# Patient Record
Sex: Male | Born: 1970 | Hispanic: No | Marital: Married | Smoking: Former smoker
Health system: Southern US, Community
[De-identification: ages and names within clinical notes are randomized; demographics above are authoritative.]

## PROBLEM LIST (undated history)

## (undated) DIAGNOSIS — F329 Major depressive disorder, single episode, unspecified: Secondary | ICD-10-CM

## (undated) DIAGNOSIS — F32A Depression, unspecified: Secondary | ICD-10-CM

## (undated) DIAGNOSIS — I499 Cardiac arrhythmia, unspecified: Secondary | ICD-10-CM

## (undated) DIAGNOSIS — M109 Gout, unspecified: Secondary | ICD-10-CM

## (undated) DIAGNOSIS — I10 Essential (primary) hypertension: Secondary | ICD-10-CM

## (undated) DIAGNOSIS — E785 Hyperlipidemia, unspecified: Secondary | ICD-10-CM

## (undated) DIAGNOSIS — M1990 Unspecified osteoarthritis, unspecified site: Secondary | ICD-10-CM

## (undated) DIAGNOSIS — C801 Malignant (primary) neoplasm, unspecified: Secondary | ICD-10-CM

## (undated) DIAGNOSIS — K219 Gastro-esophageal reflux disease without esophagitis: Secondary | ICD-10-CM

## (undated) DIAGNOSIS — K047 Periapical abscess without sinus: Secondary | ICD-10-CM

## (undated) DIAGNOSIS — R002 Palpitations: Secondary | ICD-10-CM

## (undated) DIAGNOSIS — F1020 Alcohol dependence, uncomplicated: Secondary | ICD-10-CM

## (undated) DIAGNOSIS — F419 Anxiety disorder, unspecified: Secondary | ICD-10-CM

## (undated) DIAGNOSIS — K5792 Diverticulitis of intestine, part unspecified, without perforation or abscess without bleeding: Secondary | ICD-10-CM

## (undated) DIAGNOSIS — K279 Peptic ulcer, site unspecified, unspecified as acute or chronic, without hemorrhage or perforation: Secondary | ICD-10-CM

## (undated) DIAGNOSIS — L0231 Cutaneous abscess of buttock: Secondary | ICD-10-CM

## (undated) DIAGNOSIS — L989 Disorder of the skin and subcutaneous tissue, unspecified: Secondary | ICD-10-CM

## (undated) HISTORY — DX: Depression, unspecified: F32.A

## (undated) HISTORY — DX: Diverticulitis of intestine, part unspecified, without perforation or abscess without bleeding: K57.92

## (undated) HISTORY — DX: Palpitations: R00.2

## (undated) HISTORY — DX: Gastro-esophageal reflux disease without esophagitis: K21.9

## (undated) HISTORY — DX: Hyperlipidemia, unspecified: E78.5

## (undated) HISTORY — DX: Anxiety disorder, unspecified: F41.9

## (undated) HISTORY — DX: Cutaneous abscess of buttock: L02.31

## (undated) HISTORY — DX: Major depressive disorder, single episode, unspecified: F32.9

## (undated) HISTORY — DX: Periapical abscess without sinus: K04.7

## (undated) HISTORY — DX: Alcohol dependence, uncomplicated: F10.20

## (undated) HISTORY — DX: Essential (primary) hypertension: I10

## (undated) HISTORY — DX: Peptic ulcer, site unspecified, unspecified as acute or chronic, without hemorrhage or perforation: K27.9

---

## 1997-06-28 HISTORY — PX: HAND SURGERY: SHX662

## 2010-06-30 ENCOUNTER — Ambulatory Visit
Admission: RE | Admit: 2010-06-30 | Discharge: 2010-06-30 | Payer: Self-pay | Source: Home / Self Care | Attending: Family Medicine | Admitting: Family Medicine

## 2010-06-30 DIAGNOSIS — K047 Periapical abscess without sinus: Secondary | ICD-10-CM | POA: Insufficient documentation

## 2010-06-30 DIAGNOSIS — E669 Obesity, unspecified: Secondary | ICD-10-CM | POA: Insufficient documentation

## 2010-06-30 DIAGNOSIS — L0291 Cutaneous abscess, unspecified: Secondary | ICD-10-CM

## 2010-06-30 DIAGNOSIS — L0390 Cellulitis, unspecified: Secondary | ICD-10-CM

## 2010-07-02 ENCOUNTER — Encounter: Payer: Self-pay | Admitting: Family Medicine

## 2010-07-09 ENCOUNTER — Ambulatory Visit: Admit: 2010-07-09 | Payer: Self-pay | Admitting: Family Medicine

## 2010-07-30 NOTE — Assessment & Plan Note (Signed)
Summary: NOV: ABscess buttock, abscess tooth   Vital Signs:  Patient profile:   40 year old male Height:      74 inches Weight:      290 pounds Pulse rate:   100 / minute BP sitting:   149 / 102  (right arm) Cuff size:   regular  Vitals Entered By: Avon Gully CMA, Duncan Dull) (June 30, 2010 2:37 PM) CC: NP-cyst in the crease of buttocks x 3 days   CC:  NP-cyst in the crease of buttocks x 3 days.  History of Present Illness: 3 days ago noticed a boil. Never had one on his buttock before. No drainage.  No fever.  Occ gets a boil on the groin creases.  Never had ot have I & D. They usuall resolve on their own.   Tooth infection on bottom of the right jaw.  Has noticed some drianage. Trying to get into a dentist. Hives on PCN.    Habits & Providers  Alcohol-Tobacco-Diet     Tobacco Status: current     Cigarette Packs/Day: 1.0  Exercise-Depression-Behavior     Does Patient Exercise: no     STD Risk: never     Drug Use: yes     Seat Belt Use: always  Current Medications (verified): 1)  None  Allergies (verified): 1)  ! Penicillin  Comments:  Nurse/Medical Assistant: The patient's medications and allergies were reviewed with the patient and were updated in the Medication and Allergy Lists. Avon Gully CMA, Duncan Dull) (June 30, 2010 2:40 PM)  Past History:  Past Surgical History: Right hand 1999  Family History: Father with alcoholism, Cancer, HTN  Social History: Current Smoker Alcohol use-yes Drug use-yes Regular exercise-no Smoking Status:  current Packs/Day:  1.0 Does Patient Exercise:  no STD Risk:  never Drug Use:  yes Seat Belt Use:  always  Physical Exam  General:  Well-developed,well-nourished,in no acute distress; alert,appropriate and cooperative throughout examination Mouth:  Bootm right jaw last molar is loose and has a white collored drainage around it.  Skin:  Approx 6 cm of induration right over the buttock crease. These was a  pustle in the center which I lanced and it started to drain instantly.  Sample collected and send for culture to rule out MRSA.      Impression & Recommendations:  Problem # 1:  ABSCESS, SKIN (ICD-682.9) Assessment New  Orders: T-Culture,Abcess ([id number])  His updated medication list for this problem includes:    Cleocin 300 Mg Caps (Clindamycin hcl) .Marland Kitchen... Take 1 tablet by mouth three times a day for 10 days    Doxycycline Hyclate 100 Mg Caps (Doxycycline hyclate) .Marland Kitchen... Take 1 tablet by mouth two times a day for 10 days  Elevate affected area. Warm moist compresses for 20 minutes every 2 hours while awake. Take antibiotics as directed and take acetaminophen as needed. To be seen in 48-72 hours if no improvement, sooner if worse.  Problem # 2:  ABSCESS, TOOTH (ICD-522.5) Assessment: New Needs to get in with dentist but in the meantime can take antibiotic to get his infection under control. Also given short supply fo pain med. Given clindamycin.   Complete Medication List: 1)  Cleocin 300 Mg Caps (Clindamycin hcl) .... Take 1 tablet by mouth three times a day for 10 days 2)  Doxycycline Hyclate 100 Mg Caps (Doxycycline hyclate) .... Take 1 tablet by mouth two times a day for 10 days 3)  Hydrocodone-acetaminophen 5-500 Mg Tabs (Hydrocodone-acetaminophen) .Marland Kitchen.. 1-2  tabs by mouth every 4-6 hours as needed  Patient Instructions: 1)  Soak in warm bath tonight and next couple of days to help it drain. 2)  Complete your antibiotics 3)  Follow up on Friday and let me make sure the area is healing and doesn't need to be incised.  Prescriptions: HYDROCODONE-ACETAMINOPHEN 5-500 MG TABS (HYDROCODONE-ACETAMINOPHEN) 1-2 tabs by mouth every 4-6 hours as needed  #25 x 0   Entered and Authorized by:   Nani Gasser MD   Signed by:   Nani Gasser MD on 06/30/2010   Method used:   Print then Give to Patient   RxID:   [id number] DOXYCYCLINE HYCLATE 100 MG CAPS (DOXYCYCLINE  HYCLATE) Take 1 tablet by mouth two times a day for 10 days  #20 x 0   Entered and Authorized by:   Nani Gasser MD   Signed by:   Nani Gasser MD on 06/30/2010   Method used:   Electronically to        [facility] [phone]* (retail)       [address] [place], [place]  [address]       Ph: [phone]       Fax: [phone]   RxID:   [id number] CLEOCIN 300 MG CAPS (CLINDAMYCIN HCL) Take 1 tablet by mouth three times a day for 10 days  #30 x 0   Entered and Authorized by:   Nani Gasser MD   Signed by:   Nani Gasser MD on 06/30/2010   Method used:   Electronically to        [facility] [phone]* (retail)       [address] [place], [place]  [address]       Ph: [phone]       Fax: [phone]   RxID:   [id number]    Orders Added: 1)  T-Culture,Abcess [[id number]] 2)  New Patient Level IV [20254]

## 2011-03-29 ENCOUNTER — Encounter: Payer: Self-pay | Admitting: Cardiology

## 2011-03-30 ENCOUNTER — Encounter: Payer: Self-pay | Admitting: *Deleted

## 2011-03-31 ENCOUNTER — Ambulatory Visit (INDEPENDENT_AMBULATORY_CARE_PROVIDER_SITE_OTHER): Payer: PRIVATE HEALTH INSURANCE | Admitting: Cardiology

## 2011-03-31 ENCOUNTER — Encounter: Payer: Self-pay | Admitting: Cardiology

## 2011-03-31 DIAGNOSIS — Z720 Tobacco use: Secondary | ICD-10-CM | POA: Insufficient documentation

## 2011-03-31 DIAGNOSIS — F17200 Nicotine dependence, unspecified, uncomplicated: Secondary | ICD-10-CM

## 2011-03-31 DIAGNOSIS — I10 Essential (primary) hypertension: Secondary | ICD-10-CM

## 2011-03-31 DIAGNOSIS — R002 Palpitations: Secondary | ICD-10-CM

## 2011-03-31 MED ORDER — METOPROLOL SUCCINATE ER 25 MG PO TB24: 25.0000 mg | ORAL_TABLET | Freq: Every day | ORAL | Status: DC

## 2011-03-31 NOTE — Progress Notes (Signed)
HPI: 40 year old male with no prior cardiac history or evaluation of palpitations. Patient states that he has had intermittent palpitations for approximately one year. He correlates the timing with a trial of chantix. They have occurred again in the past several weeks. He describes the sensation as feeling his heart "stop". He feels a pressure sensation in his head. There is no associated syncope, chest pain or dyspnea. B ecause of the above we were asked to further evaluate. Patient does have mild dyspnea on exertion but no orthopnea, PND, pedal edema, exertional chest pain or history of syncope.  Current Outpatient Prescriptions  Medication Sig Dispense Refill  . HYDROcodone-acetaminophen (VICODIN) 5-500 MG per tablet Take 1 tablet by mouth every 6 (six) hours as needed.          Allergies  Allergen Reactions  . Penicillins     Past Medical History  Diagnosis Date  . Abscess of buttock   . Abscessed tooth   . Peptic ulcer     Past Surgical History  Procedure Date  . Hand surgery 1999    Right    History   Social History  . Marital Status: Married    Spouse Name: N/A    Number of Children: N/A  . Years of Education: N/A   Occupational History  .      Plumber   Social History Main Topics  . Smoking status: Current Everyday Smoker  . Smokeless tobacco: Not on file  . Alcohol Use: Yes     2-3 beers per day  . Drug Use: Yes     Marijuana  . Sexually Active: Not on file   Other Topics Concern  . Not on file   Social History Narrative   Does not get regular exercise    Family History  Problem Relation Age of Onset  . Hypertension Father   . Cancer Father   . Alcohol abuse Father   . Heart disease Father     atrial fibrillation    ROS: no fevers or chills, productive cough, hemoptysis, dysphasia, odynophagia, melena, hematochezia, dysuria, hematuria, rash, seizure activity, orthopnea, PND, pedal edema, claudication. Remaining systems are negative.  Physical  Exam: General:  Well developed/well nourished in NAD Skin warm/dry Patient not depressed No peripheral clubbing Back-normal HEENT-normal/normal eyelids Neck supple/normal carotid upstroke bilaterally; no bruits; no JVD; no thyromegaly chest - CTA/ normal expansion CV - RRR/normal S1 and S2; no murmurs, rubs or gallops;  PMI nondisplaced Abdomen -NT/ND, no HSM, no mass, + bowel sounds, no bruit 2+ femoral pulses, no bruits Ext-no edema, chords, 2+ DP Neuro-grossly nonfocal  ECG NSR, LAD, no ST changes

## 2011-03-31 NOTE — Assessment & Plan Note (Signed)
Symptoms sound most likely to be related to PVCs. Will arrange echocardiogram to quantify LV function. Check TSH, magnesium and potassium. Begin Toprol 25 mg daily. If symptoms do not improve will consider CardioNet.

## 2011-03-31 NOTE — Patient Instructions (Signed)
Your physician recommends that you schedule a follow-up appointment in: 8 WEEKS  Your physician has requested that you have an echocardiogram. Echocardiography is a painless test that uses sound waves to create images of your heart. It provides your doctor with information about the size and shape of your heart and how well your heart's chambers and valves are working. This procedure takes approximately one hour. There are no restrictions for this procedure.   START METOPROLOL SUCC 25 MG ONCE DAILY  Your physician recommends that you return for lab work in: TODAY

## 2011-03-31 NOTE — Assessment & Plan Note (Signed)
Patient counseled on discontinuing. 

## 2011-03-31 NOTE — Assessment & Plan Note (Addendum)
Blood pressure is elevated today but he does not carry a diagnosis of hypertension. He states it is typically 140/80 at home. Begin Toprol for palpitations which should also help with blood pressure.

## 2011-04-01 LAB — BASIC METABOLIC PANEL
Chloride: 105 mEq/L (ref 96–112)
Glucose, Bld: 88 mg/dL (ref 70–99)
Potassium: 4.5 mEq/L (ref 3.5–5.3)
Sodium: 140 mEq/L (ref 135–145)

## 2011-04-01 LAB — TSH: TSH: 1.283 u[IU]/mL (ref 0.350–4.500)

## 2011-04-01 LAB — MAGNESIUM: Magnesium: 2.1 mg/dL (ref 1.5–2.5)

## 2011-04-06 ENCOUNTER — Telehealth: Payer: Self-pay | Admitting: Cardiology

## 2011-04-06 ENCOUNTER — Ambulatory Visit (HOSPITAL_COMMUNITY): Payer: 59 | Attending: Cardiology | Admitting: Radiology

## 2011-04-06 DIAGNOSIS — F17200 Nicotine dependence, unspecified, uncomplicated: Secondary | ICD-10-CM | POA: Insufficient documentation

## 2011-04-06 DIAGNOSIS — I517 Cardiomegaly: Secondary | ICD-10-CM

## 2011-04-06 DIAGNOSIS — R002 Palpitations: Secondary | ICD-10-CM

## 2011-04-06 DIAGNOSIS — I10 Essential (primary) hypertension: Secondary | ICD-10-CM | POA: Insufficient documentation

## 2011-04-06 DIAGNOSIS — Z8249 Family history of ischemic heart disease and other diseases of the circulatory system: Secondary | ICD-10-CM | POA: Insufficient documentation

## 2011-04-06 NOTE — Telephone Encounter (Signed)
Patient states someone called him returning call back .

## 2011-04-06 NOTE — Telephone Encounter (Signed)
PT AWARE OF LAB RESULTS./CY 

## 2011-04-07 ENCOUNTER — Telehealth: Payer: Self-pay | Admitting: Cardiology

## 2011-04-07 DIAGNOSIS — R002 Palpitations: Secondary | ICD-10-CM

## 2011-04-07 DIAGNOSIS — I10 Essential (primary) hypertension: Secondary | ICD-10-CM

## 2011-04-07 MED ORDER — METOPROLOL SUCCINATE ER 50 MG PO TB24: 50.0000 mg | ORAL_TABLET | Freq: Every day | ORAL | Status: DC

## 2011-04-07 NOTE — Telephone Encounter (Signed)
Spoke with pt, he will call back if the palpitations do not get better on the increased metoprolol Deliah Goody

## 2011-04-07 NOTE — Telephone Encounter (Signed)
Spoke with pt, he is aware of echo results. He reports that last night he had an episode of his heart skipping. He states that it skipped three times in a row. He wants to make sure dr Jens Som is aware. The pt reports that when the skipping happens he gets a severe pain in his head. The pain is a new symptom that has just started happening Will forward for dr Jens Som review Thomas Pineda

## 2011-04-07 NOTE — Telephone Encounter (Signed)
Increase toprol to 50 mg po daily Thomas Pineda

## 2011-04-07 NOTE — Telephone Encounter (Signed)
Pt returning call to [place]. Please call back.

## 2011-04-07 NOTE — Telephone Encounter (Signed)
Pt got a call yesterday that he missed.  He thinks it might be his echo report.  Please call him this pm if possible.

## 2011-05-31 ENCOUNTER — Encounter: Payer: Self-pay | Admitting: *Deleted

## 2011-06-01 ENCOUNTER — Encounter: Payer: Self-pay | Admitting: Cardiology

## 2011-06-02 ENCOUNTER — Ambulatory Visit: Payer: PRIVATE HEALTH INSURANCE | Admitting: Cardiology

## 2011-07-21 HISTORY — PX: SQUAMOUS CELL CARCINOMA EXCISION: SHX2433

## 2011-07-26 ENCOUNTER — Emergency Department (INDEPENDENT_AMBULATORY_CARE_PROVIDER_SITE_OTHER)
Admission: EM | Admit: 2011-07-26 | Discharge: 2011-07-26 | Disposition: A | Discharge status: Home / Self Care | Payer: PRIVATE HEALTH INSURANCE | Source: Home / Self Care | Attending: Family Medicine | Admitting: Family Medicine

## 2011-07-26 ENCOUNTER — Encounter: Payer: Self-pay | Admitting: *Deleted

## 2011-07-26 DIAGNOSIS — IMO0001 Reserved for inherently not codable concepts without codable children: Secondary | ICD-10-CM

## 2011-07-26 DIAGNOSIS — Z4800 Encounter for change or removal of nonsurgical wound dressing: Secondary | ICD-10-CM

## 2011-07-26 HISTORY — DX: Disorder of the skin and subcutaneous tissue, unspecified: L98.9

## 2011-07-26 NOTE — ED Provider Notes (Signed)
History     CSN: [id number]  Arrival date & time 07/26/11  0808   First MD Initiated Contact with Patient 07/26/11 0840      Chief Complaint  Patient presents with  . Wound Check    RFA      HPI Comments: Patient reports that he had excisional biopsy of a lesion on his right forearm 5 days ago at [facility].  Presently taking oral Cleocin.  He has been changing the dressing daily and applying Triple antibiotic ointment.  He had had no problems until this morning when taking a shower he noted increased bleeding at the site.  He applied a pressure dressing and bleeding has stopped.  Patient is a 41 y.o. male presenting with wound check. The history is provided by the patient.  Wound Check  Treated in ED: 5 days ago. Prior ED Treatment: Lesion removal. Treatments since wound repair include oral antibiotics. There has been bloody discharge from the wound. There is no redness present. There is no swelling present. The pain has no pain.    Past Medical History  Diagnosis Date  . Abscess of buttock   . Abscessed tooth   . Peptic ulcer   . Hypertension   . Palpitations   . Skin abnormalities     Past Surgical History  Procedure Date  . Hand surgery 1999    Right  . Growth excision 07/21/11    RFA    Family History  Problem Relation Age of Onset  . Hypertension Father   . Cancer Father   . Alcohol abuse Father   . Heart disease Father     atrial fibrillation    History  Substance Use Topics  . Smoking status: Current Everyday Smoker  . Smokeless tobacco: Not on file  . Alcohol Use: Yes     2-3 beers per day      Review of Systems  All other systems reviewed and are negative.    Allergies  Penicillins  Home Medications   Current Outpatient Rx  Name Route Sig Dispense Refill  . CLINDAMYCIN HCL 300 MG PO CAPS Oral Take 300 mg by mouth 3 (three) times daily.      Marland Kitchen HYDROCODONE-ACETAMINOPHEN 5-500 MG PO TABS Oral Take 1 tablet by mouth every  6 (six) hours as needed.        BP 152/88  Pulse 104  Temp(Src) 98.4 F (36.9 C) (Oral)  Resp 16  Ht 6\' 2"  (1.88 m)  Wt 272 lb (123.378 kg)  BMI 34.92 kg/m2  SpO2 95%  Physical Exam  Nursing note and vitals reviewed. Constitutional: He appears well-developed and well-nourished. No distress.  Musculoskeletal:       Arms:      On the right mid-forearm there is a 2cm dia superficial wound with excellent granulation tissue.  Base is almost level with surrounding skin.  No active bleeding.  No purulent drainage.  No surrounding erythema or tenderness.    ED Course  Procedures  none      1. Wound check, dressing change       MDM  No evidence infection.  Suspect that patient opened capillary when changing dressing, resulting in increased bleeding. Applied small quantity of Bacitracin.  Applied Mepelex 7.5cm Border dressing followed by light compression dressing with Coban.  Given an additional Mepelex 7.5cm Border dressing. Replace Mepelex Border dressing in about four days; apply Bacitracin.  Followup with dermatologist if develops pain, swelling, increased drainage.  Keep wound  clean and dry.        Donna Christen, MD 07/26/11 [phone]

## 2011-07-26 NOTE — ED Notes (Signed)
Patient had a "growth" removed from his RFA 5 days ago. He has not has any issues with it until this AM. Noticed the site was bleeding more than it has been and upon moving the bandage reports "excessive bleeding that then began to spurt. Bleeding has stopped and site cleaned with Hibiclens.

## 2014-01-17 ENCOUNTER — Ambulatory Visit: Payer: PRIVATE HEALTH INSURANCE | Admitting: Family Medicine

## 2014-01-21 ENCOUNTER — Ambulatory Visit (INDEPENDENT_AMBULATORY_CARE_PROVIDER_SITE_OTHER): Payer: 59 | Admitting: Family Medicine

## 2014-01-21 ENCOUNTER — Encounter: Payer: Self-pay | Admitting: Family Medicine

## 2014-01-21 VITALS — BP 144/89 | HR 85 | Ht 74.0 in | Wt 278.0 lb

## 2014-01-21 DIAGNOSIS — R002 Palpitations: Secondary | ICD-10-CM

## 2014-01-21 NOTE — Progress Notes (Signed)
Subjective:    Patient ID: Thomas Pineda, male    DOB: 1971/02/20, 43 y.o.   MRN: [id number]  HPI Here today to discuss her palpitations-it started about 5 or 6 years ago. He started having the sensation that his heart was stopping her skipping a beat and then all of a sudden would start flutter. He says sometimes would last seconds to maybe even a minute or 2. Most the time fairly brief. He sometimes will experience feeling lightheaded afterwards. He says it happens multiple times in one day he'll actually get a little bit of soreness in the left side of his chest. When it first started about 5 or 6 years ago he actually went to his primary care office. They saw something worrisome on EKG and actually called EMS immediately taken to the emergency room. He said all the way to the emergency room to ENT on the ambulance, was commenting on his her regular heartbeat. But by the time he got to the emergency room his EKG was normal. He did have an echocardiogram after that and even saw cardiologist. He was told his echo was normal. His father does have a history of heart disease.  He is a former smoker and quit in January. He has gained about 30 pounds since then.  Review of Systems  BP 144/89  Pulse 85  Ht 6\' 2"  (1.88 m)  Wt 278 lb (126.1 kg)  BMI 35.68 kg/m2    Allergies  Allergen Reactions  . Penicillins     Past Medical History  Diagnosis Date  . Abscess of buttock   . Abscessed tooth   . Peptic ulcer   . Hypertension   . Palpitations   . Skin abnormalities     Past Surgical History  Procedure Laterality Date  . Hand surgery  1999    Right  . Growth excision  07/21/11    RFA    History   Social History  . Marital Status: Married    Spouse Name: N/A    Number of Children: N/A  . Years of Education: N/A   Occupational History  .      Plumber   Social History Main Topics  . Smoking status: Former Smoker    Quit date: 07/29/2013  . Smokeless tobacco: Not on file  .  Alcohol Use: Yes     Comment: 2-3 beers per day  . Drug Use: Yes    Special: Marijuana     Comment: Marijuana  . Sexual Activity: Not on file   Other Topics Concern  . Not on file   Social History Narrative   Does not get regular exercise    Family History  Problem Relation Age of Onset  . Hypertension Father   . Cancer Father   . Alcohol abuse Father   . Heart disease Father     atrial fibrillation    Outpatient Encounter Prescriptions as of 01/21/2014  Medication Sig  . oxyCODONE-acetaminophen (PERCOCET/ROXICET) 5-325 MG per tablet Take 1 tablet by mouth every 4 (four) hours as needed for severe pain.  . clindamycin (CLEOCIN) 300 MG capsule Take 300 mg by mouth 3 (three) times daily.    Marland Kitchen HYDROcodone-acetaminophen (VICODIN) 5-500 MG per tablet Take 1 tablet by mouth every 6 (six) hours as needed.            Objective:   Physical Exam  Constitutional: He is oriented to person, place, and time. He appears well-developed and well-nourished.  HENT:  Head:  Normocephalic and atraumatic.  Neck: Neck supple. No thyromegaly present.  Cardiovascular: Normal rate, regular rhythm and normal heart sounds.   Pulmonary/Chest: Effort normal and breath sounds normal.  Lymphadenopathy:    He has no cervical adenopathy.  Neurological: He is alert and oriented to person, place, and time.  Skin: Skin is warm and dry.  Psychiatric: He has a normal mood and affect. His behavior is normal.          Assessment & Plan:  Palpitations/skipped beats-will get EKG today. We'll schedule for a cardiac monitor for further evaluation. We'll also check blood work including evaluating for thyroid problem 7 anemia.  EKG EKG shows rate of 87 beats per minute with normal sinus rhythm. He does have what appears to be an old Q wave in lead 3. Normal axis. With no acute ST-T wave changes.  Tobacco abuse-congratulated him on smoking cessation. Encouraged him to work on diet since he is no longer  smoking. We'll also check his thyroid level since he has had a 30 pound weight gain in the last 6 months.

## 2014-01-22 LAB — CBC WITH DIFFERENTIAL/PLATELET
BASOS ABS: 0 10*3/uL (ref 0.0–0.1)
Basophils Relative: 0 % (ref 0–1)
EOS ABS: 0.2 10*3/uL (ref 0.0–0.7)
EOS PCT: 2 % (ref 0–5)
HCT: 43.3 % (ref 39.0–52.0)
Hemoglobin: 14.6 g/dL (ref 13.0–17.0)
Lymphocytes Relative: 24 % (ref 12–46)
Lymphs Abs: 2.6 10*3/uL (ref 0.7–4.0)
MCH: 29.8 pg (ref 26.0–34.0)
MCHC: 33.7 g/dL (ref 30.0–36.0)
MCV: 88.4 fL (ref 78.0–100.0)
MONO ABS: 0.7 10*3/uL (ref 0.1–1.0)
Monocytes Relative: 7 % (ref 3–12)
Neutro Abs: 7.2 10*3/uL (ref 1.7–7.7)
Neutrophils Relative %: 67 % (ref 43–77)
PLATELETS: 255 10*3/uL (ref 150–400)
RBC: 4.9 MIL/uL (ref 4.22–5.81)
RDW: 13.8 % (ref 11.5–15.5)
WBC: 10.7 10*3/uL — ABNORMAL HIGH (ref 4.0–10.5)

## 2014-01-22 LAB — COMPLETE METABOLIC PANEL WITH GFR
ALT: 49 U/L (ref 0–53)
AST: 26 U/L (ref 0–37)
Albumin: 4.4 g/dL (ref 3.5–5.2)
Alkaline Phosphatase: 83 U/L (ref 39–117)
BILIRUBIN TOTAL: 0.7 mg/dL (ref 0.2–1.2)
BUN: 14 mg/dL (ref 6–23)
CO2: 28 meq/L (ref 19–32)
CREATININE: 0.94 mg/dL (ref 0.50–1.35)
Calcium: 9.2 mg/dL (ref 8.4–10.5)
Chloride: 104 mEq/L (ref 96–112)
GFR, Est Non African American: >89 mL/min
Glucose, Bld: 67 mg/dL — ABNORMAL LOW (ref 70–99)
Potassium: 4.3 mEq/L (ref 3.5–5.3)
Sodium: 139 mEq/L (ref 135–145)
Total Protein: 7.1 g/dL (ref 6.0–8.3)

## 2014-01-22 LAB — TSH: TSH: 1.019 u[IU]/mL (ref 0.350–4.500)

## 2014-01-28 ENCOUNTER — Encounter: Payer: Self-pay | Admitting: *Deleted

## 2014-01-28 ENCOUNTER — Encounter (INDEPENDENT_AMBULATORY_CARE_PROVIDER_SITE_OTHER): Payer: 59

## 2014-01-28 DIAGNOSIS — R002 Palpitations: Secondary | ICD-10-CM

## 2014-01-28 NOTE — Progress Notes (Signed)
Patient ID: Thomas Pineda, male   DOB: 05-06-1971, 43 y.o.   MRN: [id number] [employer] 30 day cardiac event monitor applied to patient.

## 2014-02-19 ENCOUNTER — Other Ambulatory Visit: Payer: Self-pay | Admitting: *Deleted

## 2014-02-19 DIAGNOSIS — Z0000 Encounter for general adult medical examination without abnormal findings: Secondary | ICD-10-CM

## 2014-02-21 ENCOUNTER — Encounter: Payer: 59 | Admitting: Family Medicine

## 2014-03-09 ENCOUNTER — Encounter: Payer: Self-pay | Admitting: Family Medicine

## 2014-03-09 DIAGNOSIS — E785 Hyperlipidemia, unspecified: Secondary | ICD-10-CM | POA: Insufficient documentation

## 2014-03-09 LAB — LIPID PANEL
CHOL/HDL RATIO: 4.4 ratio
Cholesterol: 236 mg/dL — ABNORMAL HIGH (ref 0–200)
HDL: 54 mg/dL (ref >39)
LDL CALC: 113 mg/dL — AB (ref 0–99)
TRIGLYCERIDES: 343 mg/dL — AB (ref <150)
VLDL: 69 mg/dL — AB (ref 0–40)

## 2014-03-12 ENCOUNTER — Encounter: Payer: Self-pay | Admitting: Family Medicine

## 2014-03-12 ENCOUNTER — Ambulatory Visit (INDEPENDENT_AMBULATORY_CARE_PROVIDER_SITE_OTHER): Payer: 59 | Admitting: Family Medicine

## 2014-03-12 VITALS — BP 136/84 | HR 92 | Ht 74.0 in | Wt 293.0 lb

## 2014-03-12 DIAGNOSIS — R7309 Other abnormal glucose: Secondary | ICD-10-CM

## 2014-03-12 DIAGNOSIS — Z0000 Encounter for general adult medical examination without abnormal findings: Secondary | ICD-10-CM

## 2014-03-12 DIAGNOSIS — M25559 Pain in unspecified hip: Secondary | ICD-10-CM

## 2014-03-12 DIAGNOSIS — M25552 Pain in left hip: Secondary | ICD-10-CM

## 2014-03-12 LAB — POCT GLYCOSYLATED HEMOGLOBIN (HGB A1C): Hemoglobin A1C: 5.7

## 2014-03-12 MED ORDER — PHENTERMINE HCL 15 MG PO CAPS
15.0000 mg | ORAL_CAPSULE | ORAL | Status: DC
Start: 2014-03-12 — End: 2014-03-13

## 2014-03-12 NOTE — Progress Notes (Signed)
Subjective:    Patient ID: Thomas Pineda, male    DOB: Jan 27, 1971, 43 y.o.   MRN: [id number]  HPI  Here for CPE today. He has several concerns.  Tetanus is up to date.    Has gained 50 lbs in the last 6 months.  Stays up late and snacks. Quit smoking around that time.  Feels like he has not been eating great. Says his self esteem is really down and feels more depressed recently. His wife is here with him.  He also has a baby.  Says has been tempted to start smoking again bc of teh weight gain.   Left hip pain for years. Says had xrays done about 3 years ago and was told something may be off but then saw a specialist who told him nothing was wrong with his hip. Says sometimes keeps him awake at night.    Review of Systems Comprehensive ROS is negative  BP 136/84  Pulse 92  Ht 6\' 2"  (1.88 m)  Wt 293 lb (132.904 kg)  BMI 37.60 kg/m2    Allergies  Allergen Reactions  . Penicillins     Past Medical History  Diagnosis Date  . Abscess of buttock   . Abscessed tooth   . Peptic ulcer   . Hypertension   . Palpitations   . Skin abnormalities     Past Surgical History  Procedure Laterality Date  . Hand surgery  1999    Right  . Growth excision  07/21/11    RFA    History   Social History  . Marital Status: Married    Spouse Name: N/A    Number of Children: N/A  . Years of Education: N/A   Occupational History  .      Plumber   Social History Main Topics  . Smoking status: Former Smoker    Quit date: 07/29/2013  . Smokeless tobacco: Not on file  . Alcohol Use: Yes     Comment: 2-3 beers per day  . Drug Use: Yes    Special: Marijuana     Comment: Marijuana  . Sexual Activity: Yes    Partners: Female   Other Topics Concern  . Not on file   Social History Narrative   Does not get regular exercise.      Family History  Problem Relation Age of Onset  . Hypertension Father   . Renal cancer Father   . Alcohol abuse Father   . Heart disease Father     atrial  fibrillation  . Diabetes    . Throat cancer      GF  . Breast cancer      GM  . Brain cancer Paternal Aunt     Outpatient Encounter Prescriptions as of 03/12/2014  Medication Sig  . phentermine 15 MG capsule Take 1 capsule (15 mg total) by mouth every morning.  . [DISCONTINUED] clindamycin (CLEOCIN) 300 MG capsule Take 300 mg by mouth 3 (three) times daily.    . [DISCONTINUED] HYDROcodone-acetaminophen (VICODIN) 5-500 MG per tablet Take 1 tablet by mouth every 6 (six) hours as needed.    . [DISCONTINUED] oxyCODONE-acetaminophen (PERCOCET/ROXICET) 5-325 MG per tablet Take 1 tablet by mouth every 4 (four) hours as needed for severe pain.          Objective:   Physical Exam  Constitutional: He is oriented to person, place, and time. He appears well-developed and well-nourished.  HENT:  Head: Normocephalic and atraumatic.  Right Ear: External ear  normal.  Left Ear: External ear normal.  Nose: Nose normal.  Mouth/Throat: Oropharynx is clear and moist.  Eyes: Conjunctivae and EOM are normal. Pupils are equal, round, and reactive to light.  Neck: Normal range of motion. Neck supple. No thyromegaly present.  Cardiovascular: Normal rate, regular rhythm, normal heart sounds and intact distal pulses.   Pulmonary/Chest: Effort normal and breath sounds normal.  Abdominal: Soft. Bowel sounds are normal. He exhibits no distension and no mass. There is no tenderness. There is no rebound and no guarding.  Musculoskeletal: Normal range of motion.  Lymphadenopathy:    He has no cervical adenopathy.  Neurological: He is alert and oriented to person, place, and time. He has normal reflexes.  Skin: Skin is warm and dry.  Psychiatric: He has a normal mood and affect. His behavior is normal. Judgment and thought content normal.    Left hip - pain with internal and external rotation      Assessment & Plan:  CPE Keep up a regular exercise program and make sure you are eating a healthy diet Try  to eat 4 servings of dairy a day, or if you are lactose intolerant take a calcium with vitamin D daily.  Your vaccines are up to date.  Declined flu vaccine today.    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 Will get repeat xrays today. Consider avascualr necrosis as well. Will call with results.    ABnormal weight gain -Discussed diet and exercise stategies.  Recommended [employer] smat phone app. Discussed strategies for exercise.  Encouraged him not to start smoking again. Discussed options for weight loss medications.  Will start phentermine. WArned about potentia S.E. Stop if causes any CP or palpitations.  Will start with low dose. Wil f/u in 1 mo for BP and weight check.

## 2014-03-13 ENCOUNTER — Telehealth: Payer: Self-pay | Admitting: *Deleted

## 2014-03-13 MED ORDER — PHENTERMINE HCL 30 MG PO TBDP: ORAL_TABLET | ORAL | Status: DC

## 2014-03-13 NOTE — Telephone Encounter (Signed)
Pt's wife called in and stated that the cost of the phentermine 15 mg capsules is the same as the 30 mg tablets and wanted to know if Dr. Madilyn Fireman would change this to the 30 mg tablets instead. I called the Day to see if this came in a 15 mg tablet and it does not. He has not p/u this Rx.Audelia Hives [place]

## 2014-03-13 NOTE — Telephone Encounter (Signed)
Called pt's wife and informed her of change.Thomas Pineda

## 2014-03-13 NOTE — Telephone Encounter (Signed)
Okay to change to 30 mg tablets. Instructions should say to take half daily

## 2014-03-14 ENCOUNTER — Telehealth: Payer: Self-pay

## 2014-03-14 NOTE — Telephone Encounter (Signed)
Let's just go back to the 15mg  capsule once a day as per original Rx. He says he is sensitive to stimulants so I would rather start low and see how does over the next month.

## 2014-03-14 NOTE — Telephone Encounter (Signed)
[facility] called; the phentermine 30 mg only comes in capsules, so it can not be halved. They also stated the 37.5 mg is a lot cheaper. Could the phentermine 30 mg be switched to 37.5 mg? Please advise.

## 2014-03-19 NOTE — Telephone Encounter (Signed)
Resolved by an unknown medical assistant. Patient received the 15 mg tablets.

## 2014-03-20 ENCOUNTER — Telehealth: Payer: Self-pay | Admitting: *Deleted

## 2014-03-20 NOTE — Telephone Encounter (Signed)
Monitor reviewed by dr Stanford Breed shows sinus with rare pac, pvc, and couplet  pt aware of results

## 2014-04-16 ENCOUNTER — Ambulatory Visit (INDEPENDENT_AMBULATORY_CARE_PROVIDER_SITE_OTHER): Payer: 59

## 2014-04-16 DIAGNOSIS — M25552 Pain in left hip: Secondary | ICD-10-CM

## 2014-04-23 ENCOUNTER — Ambulatory Visit: Payer: 59 | Admitting: Family Medicine

## 2014-05-16 ENCOUNTER — Encounter: Payer: Self-pay | Admitting: Family Medicine

## 2014-05-16 ENCOUNTER — Ambulatory Visit (INDEPENDENT_AMBULATORY_CARE_PROVIDER_SITE_OTHER): Payer: 59 | Admitting: Family Medicine

## 2014-05-16 VITALS — BP 108/66 | HR 82 | Temp 98.0°F | Ht 74.0 in | Wt 287.0 lb

## 2014-05-16 DIAGNOSIS — R635 Abnormal weight gain: Secondary | ICD-10-CM

## 2014-05-16 DIAGNOSIS — Z6836 Body mass index (BMI) 36.0-36.9, adult: Secondary | ICD-10-CM

## 2014-05-16 DIAGNOSIS — R0789 Other chest pain: Secondary | ICD-10-CM

## 2014-05-16 DIAGNOSIS — E669 Obesity, unspecified: Secondary | ICD-10-CM

## 2014-05-16 MED ORDER — PHENTERMINE HCL 37.5 MG PO TABS: 37.5000 mg | ORAL_TABLET | Freq: Every day | ORAL | Status: DC

## 2014-05-16 NOTE — Addendum Note (Signed)
Addended by: Beatrice Lecher D on: 05/16/2014 05:18 PM   Modules accepted: Level of Service

## 2014-05-16 NOTE — Progress Notes (Addendum)
   Subjective:    Patient ID: Thomas Pineda, male    DOB: 27-Jul-1970, 43 y.o.   MRN: [id number]  HPI   F/U on abnormal weight gain.  Hasn't been snacking as much.  Has really worked on portions. His wife is here within today and feels like he's actually been doing a very good job with diet.. Walking about 2-3 days per week.  Hasn't walked as much the last 2 weeks because of the rain. He denies any chest pain, short breath, palpitations on the phentermine. He would like to try going up to 37.5 mg. She has been taking the 15 mg tab.  Initially had denied chest pain but later in the visit he mentioned that he does occasionally get a severity specific chest discomfort in the center of his chest. He says it can last for hours at a time. Happens randomly. Can happen once or twice per month. He does have a history of duodenal ulcers and reflux but does not take her PPI regularly. He will use it Nexium as needed. He denies any sensation of palpitations or skipped beats when this occurs. No nausea. Never radiates. Always in the exact same location. This has been going on since before he started the phentermine. He feels like taking the phentermine has not affected the duration or frequency of these episodes.  Review of Systems     Objective:   Physical Exam  Constitutional: He is oriented to person, place, and time. He appears well-developed and well-nourished.  HENT:  Head: Normocephalic and atraumatic.  Cardiovascular: Normal rate, regular rhythm and normal heart sounds.   Pulmonary/Chest: Effort normal and breath sounds normal.  Neurological: He is alert and oriented to person, place, and time.  Skin: Skin is warm and dry.  Psychiatric: He has a normal mood and affect. His behavior is normal.          Assessment & Plan:  Abnormal weight gain.  He's doing a great job so far. He's lost 6 pounds. We will try increasing the dose to 37.5 mg. He started his current dose well. If he doesn't he can  always cut them in half. Continue to work on getting regular exercise in. Encouraged him that as the weather gets colder he may have to have some other strategies than walking home to get in his exercise routine. We discussed the importance of really trying to readve up his metabolism with his diet and exercise. He sent a great job so far with this diet.  Atypical chest pain-could be GERD related or possibly esophageal spasm related. Encouraged him to get back on his Nexium more regularly and see if he notices a reduction in his symptoms. Especially with a prior history of duodenal ulcers. If it continues to recur then recommend further workup.

## 2014-06-13 ENCOUNTER — Ambulatory Visit (INDEPENDENT_AMBULATORY_CARE_PROVIDER_SITE_OTHER): Payer: 59 | Admitting: Family Medicine

## 2014-06-13 VITALS — BP 134/85 | HR 85 | Ht 74.0 in | Wt 285.0 lb

## 2014-06-13 DIAGNOSIS — R635 Abnormal weight gain: Secondary | ICD-10-CM

## 2014-06-13 MED ORDER — PHENTERMINE HCL 37.5 MG PO TABS: 37.5000 mg | ORAL_TABLET | Freq: Every day | ORAL | Status: DC

## 2014-06-13 NOTE — Progress Notes (Signed)
   Subjective:    Patient ID: Thomas Pineda, male    DOB: 04/22/1971, 43 y.o.   MRN: [id number]  HPI  Thomas Pineda reports to office today for weight and BP check in order to obtain phentermine refill. He denies headache, CP or SOB. He does report that he has forgotten to take the pills due to work schedule and his exercise routine was also not consistent with the walking either.      Review of Systems     Objective:   Physical Exam        Assessment & Plan:  Abnormal weight gain - has lost 2 pounds. Encouraged him to be as consistent as possible with his walking routine and the medication. We'll go ahead and refill the medication today area follow-up in one month for nurse blood pressure and weight check.

## 2014-07-18 ENCOUNTER — Ambulatory Visit: Payer: Self-pay

## 2014-07-24 ENCOUNTER — Telehealth: Payer: Self-pay | Admitting: Family Medicine

## 2014-07-24 ENCOUNTER — Telehealth: Payer: Self-pay

## 2014-07-24 ENCOUNTER — Ambulatory Visit (INDEPENDENT_AMBULATORY_CARE_PROVIDER_SITE_OTHER): Payer: 59 | Admitting: Family Medicine

## 2014-07-24 ENCOUNTER — Ambulatory Visit (HOSPITAL_BASED_OUTPATIENT_CLINIC_OR_DEPARTMENT_OTHER)
Admission: RE | Admit: 2014-07-24 | Discharge: 2014-07-24 | Disposition: A | Discharge status: Home / Self Care | Payer: 59 | Source: Ambulatory Visit | Attending: Family Medicine | Admitting: Family Medicine

## 2014-07-24 ENCOUNTER — Encounter (HOSPITAL_BASED_OUTPATIENT_CLINIC_OR_DEPARTMENT_OTHER): Payer: Self-pay

## 2014-07-24 ENCOUNTER — Encounter: Payer: Self-pay | Admitting: Family Medicine

## 2014-07-24 VITALS — BP 156/79 | HR 80 | Wt 280.0 lb

## 2014-07-24 DIAGNOSIS — R933 Abnormal findings on diagnostic imaging of other parts of digestive tract: Secondary | ICD-10-CM | POA: Insufficient documentation

## 2014-07-24 DIAGNOSIS — R1032 Left lower quadrant pain: Secondary | ICD-10-CM

## 2014-07-24 MED ORDER — CIPROFLOXACIN HCL 500 MG PO TABS: 500.0000 mg | ORAL_TABLET | Freq: Two times a day (BID) | ORAL | Status: DC

## 2014-07-24 MED ORDER — METRONIDAZOLE 500 MG PO TABS: 500.0000 mg | ORAL_TABLET | Freq: Three times a day (TID) | ORAL | Status: DC

## 2014-07-24 MED ORDER — KETOROLAC TROMETHAMINE 60 MG/2ML IM SOLN
60.0000 mg | Freq: Once | INTRAMUSCULAR | Status: AC
  Administered 2014-07-24: 60 mg via INTRAMUSCULAR

## 2014-07-24 MED ORDER — IOHEXOL 300 MG/ML  SOLN
100.0000 mL | Freq: Once | INTRAMUSCULAR | Status: AC | PRN
  Administered 2014-07-24: 100 mL via INTRAVENOUS

## 2014-07-24 NOTE — Telephone Encounter (Signed)
PA for CT abdomen and pelvis with contrast approved [phone]

## 2014-07-24 NOTE — Telephone Encounter (Signed)
Pt.notified

## 2014-07-24 NOTE — Progress Notes (Signed)
CC: Thomas Pineda is a 44 y.o. male is here for Abdominal Pain   Subjective: HPI:  2 days of left lower quadrant pain that radiates towards the navel And also into the left back. It is described as burning sharp and tearing. It was mild at the onset however slowly has reached a severe severity. It seems to fluctuate throughout the day worse with bending forward or when having bowel movements. No better or worse really after a bowel movement. He's having regular bowel movements and his opinion, passing once a day without any diarrhea or constipation. Interventions have included a laxative that he took yesterday however symptoms are worsening. He gets waves of nausea but appetite is the same overall. He gets waves of cold sweats but has denied fevers. He's never had this before. He denies any testicular pain, dysuria, nor blood in his urine or stool. Denies melena. States this is the worst pain he is ever felt   Review Of Systems Outlined In HPI  Past Medical History  Diagnosis Date  . Abscess of buttock   . Abscessed tooth   . Peptic ulcer   . Hypertension   . Palpitations   . Skin abnormalities     Past Surgical History  Procedure Laterality Date  . Hand surgery  1999    Right  . Growth excision  07/21/11    RFA   Family History  Problem Relation Age of Onset  . Hypertension Father   . Renal cancer Father   . Alcohol abuse Father   . Heart disease Father     atrial fibrillation  . Diabetes    . Throat cancer      GF  . Breast cancer      GM  . Brain cancer Paternal Aunt     History   Social History  . Marital Status: Married    Spouse Name: N/A    Number of Children: N/A  . Years of Education: N/A   Occupational History  .      Plumber   Social History Main Topics  . Smoking status: Former Smoker    Quit date: 07/29/2013  . Smokeless tobacco: Not on file  . Alcohol Use: Yes     Comment: 2-3 beers per day  . Drug Use: Yes    Special: Marijuana     Comment:  Marijuana  . Sexual Activity:    Partners: Female   Other Topics Concern  . Not on file   Social History Narrative   Does not get regular exercise.       Objective: BP 156/79 mmHg  Pulse 80  Wt 280 lb (127.007 kg)  SpO2 98%  General: Alert and Oriented, appears mildly to moderately uncomfortable  HEENT: Pupils equal, round, reactive to light. Conjunctivae clear. Moist mucous membranes  Lungs:Clear comfortable work of breathing  Cardiac: Regular rate and rhythm. Abdomen: Obese, guarding when palpating the left lower quadrant, rebound tenderness in the left lower quadrant and left upper quadrant. No palpable masses. Psoas sign positive on the left. Absent bowel sounds. Extremities: No peripheral edema.  Strong peripheral pulses.  Mental Status: No depression, anxiety, nor agitation. Skin: Warm and dry.  Assessment & Plan: Thomas Pineda was seen today for abdominal pain.  Diagnoses and associated orders for this visit:  LLQ pain - CT Abdomen Pelvis W Contrast; Future - ciprofloxacin (CIPRO) 500 MG tablet; Take 1 tablet (500 mg total) by mouth 2 (two) times daily. - metroNIDAZOLE (FLAGYL) 500 MG tablet;  Take 1 tablet (500 mg total) by mouth 3 (three) times daily.     left lower quadrant pain highly concerning for diverticulitis. I sent him for CT scan to rule out abscess that would require hospitalization. I've asked him to go down to the radiology office to pick up oral contrast and schedule his CT scan as soon as possible today, then proceed to a local pharmacy and start ciprofloxacin and metronidazole pending the results of the CT scan. Also follow-up and plan will be based on the results of CT scan. Signs and symptoms requring emergent/urgent reevaluation were discussed with the patient.  Return if symptoms worsen or fail to improve.

## 2014-07-24 NOTE — Telephone Encounter (Signed)
Seth Bake, Can you please let patient know that his CT scan confirmed diverticulitis and fortunately did not show any signs of an abscess.  This is reassuring in that he should be able to be treated with the ciprofloxacin and metronidazole regimen on an outpatient basis.  Along with these antibiotics I'd recommend he limit himself to a clear liquid diet for the next two days (jello, ginger ale, clear soda, juices, etc) then slowly advance diet as tolerated.  I'd recommend f/u with Dr. Jerilynn Mages his PCP next week for re-evaluation.

## 2014-07-24 NOTE — Addendum Note (Signed)
Addended by: Terance Hart on: 07/24/2014 04:18 PM   Modules accepted: Orders

## 2015-05-26 ENCOUNTER — Ambulatory Visit (INDEPENDENT_AMBULATORY_CARE_PROVIDER_SITE_OTHER): Payer: 59 | Admitting: Family Medicine

## 2015-05-26 ENCOUNTER — Encounter: Payer: Self-pay | Admitting: Family Medicine

## 2015-05-26 ENCOUNTER — Ambulatory Visit (INDEPENDENT_AMBULATORY_CARE_PROVIDER_SITE_OTHER): Payer: 59

## 2015-05-26 VITALS — BP 155/100 | HR 113 | Wt 292.0 lb

## 2015-05-26 DIAGNOSIS — S92422A Displaced fracture of distal phalanx of left great toe, initial encounter for closed fracture: Secondary | ICD-10-CM

## 2015-05-26 DIAGNOSIS — X58XXXA Exposure to other specified factors, initial encounter: Secondary | ICD-10-CM | POA: Diagnosis not present

## 2015-05-26 DIAGNOSIS — M79675 Pain in left toe(s): Secondary | ICD-10-CM

## 2015-05-26 MED ORDER — COLCHICINE 0.6 MG PO TABS: 0.6000 mg | ORAL_TABLET | Freq: Every day | ORAL | Status: DC

## 2015-05-26 MED ORDER — COLCHICINE 0.6 MG PO CAPS: 0.6000 mg | ORAL_CAPSULE | Freq: Every day | ORAL | Status: DC

## 2015-05-26 MED ORDER — OXYCODONE HCL 5 MG PO TABS: 5.0000 mg | ORAL_TABLET | Freq: Four times a day (QID) | ORAL | Status: DC | PRN

## 2015-05-26 NOTE — Progress Notes (Signed)
   Subjective:    I'm seeing this patient as a consultation for:  Dr Madilyn Fireman and  Dr Jefferson Fuel, Mora Appl, MD  [address]  [place], [place] [address]  [phone]  [phone] (Fax)   CC: Left great toe pain  HPI: Patient had 6 days of left great toe pain. He was at work when a ladder that he was using buckled. He kicked the latter with his left foot. He noted mild pain initially. However the past few days she's had worsening great toe MTP pain and swelling and redness. He was seen in the emergency department at [facility] November 26 where x-rays were negative. Uric acid was 8.1 . Was given a cam walker boot, crutches, and oxycodone and sent for follow-up. He notes the pain is worsened. He has severe pain. No fevers chills nausea vomiting or diarrhea.  Past medical history, Surgical history, Family history not pertinant except as noted below, Social history, Allergies, and medications have been entered into the medical record, reviewed, and no changes needed.   Review of Systems: No headache, visual changes, nausea, vomiting, diarrhea, constipation, dizziness, abdominal pain, skin rash, fevers, chills, night sweats, weight loss, swollen lymph nodes, body aches, joint swelling, muscle aches, chest pain, shortness of breath, mood changes, visual or auditory hallucinations.   Objective:    Filed Vitals:   05/26/15 1102  BP: 155/100  Pulse: 113   General: Well Developed, well nourished, and in no acute distress.  Neuro/Psych: Alert and oriented x3, extra-ocular muscles intact, able to move all 4 extremities, sensation grossly intact. Skin: Warm and dry, no rashes noted.  Respiratory: Not using accessory muscles, speaking in full sentences, trachea midline.  Cardiovascular: Pulses palpable, no extremity edema. Abdomen: Does not appear distended. MSK: Erythema and swelling and tenderness of left first great toe. Exquisite tenderness with motion. Capillary refill  and pulses are intact. Sensation is intact distally.  Labs, x-ray report, and emergency room report reviewed using care everywhere.  X-ray preliminary left foot 05/26/2015: No obvious fracture at the left first MTP.  Possible fracture at the distal phalanx. Awaiting formal radiology review.   No results found for this or any previous visit (from the past 24 hour(s)). No results found.  Impression and Recommendations:   This case required medical decision making of moderate complexity.

## 2015-05-26 NOTE — Progress Notes (Signed)
Note faxed to [phone].

## 2015-05-26 NOTE — Assessment & Plan Note (Addendum)
Symptoms almost certainly related to acute gout flare. His x-ray does show possible distal phalanx fracture however the majority of his pain and swelling is at the first MTP. Injury is a possibility however symptoms, uric acid of 8.1, and appearance on exam today are all consistent with gout flare. Continue cam walker oxycodone and crutches. Treat with colchicine. Recheck in 1 week.

## 2015-05-26 NOTE — Patient Instructions (Signed)
Thank you for coming in today. Get whichever form of colchicine is cheapest.  Return in 1 week.  Take colchicine 2x daily for a few days then switch to once daily.   Gout Gout is an inflammatory arthritis caused by a buildup of uric acid crystals in the joints. Uric acid is a chemical that is normally present in the blood. When the level of uric acid in the blood is too high it can form crystals that deposit in your joints and tissues. This causes joint redness, soreness, and swelling (inflammation). Repeat attacks are common. Over time, uric acid crystals can form into masses (tophi) near a joint, destroying bone and causing disfigurement. Gout is treatable and often preventable. CAUSES  The disease begins with elevated levels of uric acid in the blood. Uric acid is produced by your body when it breaks down a naturally found substance called purines. Certain foods you eat, such as meats and fish, contain high amounts of purines. Causes of an elevated uric acid level include:  Being passed down from parent to child (heredity).  Diseases that cause increased uric acid production (such as obesity, psoriasis, and certain cancers).  Excessive alcohol use.  Diet, especially diets rich in meat and seafood.  Medicines, including certain cancer-fighting medicines (chemotherapy), water pills (diuretics), and aspirin.  Chronic kidney disease. The kidneys are no longer able to remove uric acid well.  Problems with metabolism. Conditions strongly associated with gout include:  Obesity.  High blood pressure.  High cholesterol.  Diabetes. Not everyone with elevated uric acid levels gets gout. It is not understood why some people get gout and others do not. Surgery, joint injury, and eating too much of certain foods are some of the factors that can lead to gout attacks. SYMPTOMS   An attack of gout comes on quickly. It causes intense pain with redness, swelling, and warmth in a joint.  Fever  can occur.  Often, only one joint is involved. Certain joints are more commonly involved:  Base of the big toe.  Knee.  Ankle.  Wrist.  Finger. Without treatment, an attack usually goes away in a few days to weeks. Between attacks, you usually will not have symptoms, which is different from many other forms of arthritis. DIAGNOSIS  Your caregiver will suspect gout based on your symptoms and exam. In some cases, tests may be recommended. The tests may include:  Blood tests.  Urine tests.  X-rays.  Joint fluid exam. This exam requires a needle to remove fluid from the joint (arthrocentesis). Using a microscope, gout is confirmed when uric acid crystals are seen in the joint fluid. TREATMENT  There are two phases to gout treatment: treating the sudden onset (acute) attack and preventing attacks (prophylaxis).  Treatment of an Acute Attack.  Medicines are used. These include anti-inflammatory medicines or steroid medicines.  An injection of steroid medicine into the affected joint is sometimes necessary.  The painful joint is rested. Movement can worsen the arthritis.  You may use warm or cold treatments on painful joints, depending which works best for you.  Treatment to Prevent Attacks.  If you suffer from frequent gout attacks, your caregiver may advise preventive medicine. These medicines are started after the acute attack subsides. These medicines either help your kidneys eliminate uric acid from your body or decrease your uric acid production. You may need to stay on these medicines for a very long time.  The early phase of treatment with preventive medicine can be associated with  an increase in acute gout attacks. For this reason, during the first few months of treatment, your caregiver may also advise you to take medicines usually used for acute gout treatment. Be sure you understand your caregiver's directions. Your caregiver may make several adjustments to your medicine  dose before these medicines are effective.  Discuss dietary treatment with your caregiver or dietitian. Alcohol and drinks high in sugar and fructose and foods such as meat, poultry, and seafood can increase uric acid levels. Your caregiver or dietitian can advise you on drinks and foods that should be limited. HOME CARE INSTRUCTIONS   Do not take aspirin to relieve pain. This raises uric acid levels.  Only take over-the-counter or prescription medicines for pain, discomfort, or fever as directed by your caregiver.  Rest the joint as much as possible. When in bed, keep sheets and blankets off painful areas.  Keep the affected joint raised (elevated).  Apply warm or cold treatments to painful joints. Use of warm or cold treatments depends on which works best for you.  Use crutches if the painful joint is in your leg.  Drink enough fluids to keep your urine clear or pale yellow. This helps your body get rid of uric acid. Limit alcohol, sugary drinks, and fructose drinks.  Follow your dietary instructions. Pay careful attention to the amount of protein you eat. Your daily diet should emphasize fruits, vegetables, whole grains, and fat-free or low-fat milk products. Discuss the use of coffee, vitamin C, and cherries with your caregiver or dietitian. These may be helpful in lowering uric acid levels.  Maintain a healthy body weight. SEEK MEDICAL CARE IF:   You develop diarrhea, vomiting, or any side effects from medicines.  You do not feel better in 24 hours, or you are getting worse. SEEK IMMEDIATE MEDICAL CARE IF:   Your joint becomes suddenly more tender, and you have chills or a fever. MAKE SURE YOU:   Understand these instructions.  Will watch your condition.  Will get help right away if you are not doing well or get worse.   This information is not intended to replace advice given to you by your health care provider. Make sure you discuss any questions you have with your health  care provider.   Document Released: 06/11/2000 Document Revised: 07/05/2014 Document Reviewed: 01/26/2012 Elsevier Interactive Patient Education [employer].

## 2015-05-27 NOTE — Progress Notes (Signed)
Quick Note:  1) The big toe could be broken at the first joint (not the big joint that is painful) 2) I do think you are also having a gout flair.  No change in plan.  How is pain doing? ______

## 2015-06-04 ENCOUNTER — Ambulatory Visit: Payer: 59 | Admitting: Family Medicine

## 2015-06-05 ENCOUNTER — Encounter: Payer: Self-pay | Admitting: Family Medicine

## 2015-06-05 ENCOUNTER — Ambulatory Visit (INDEPENDENT_AMBULATORY_CARE_PROVIDER_SITE_OTHER): Payer: 59

## 2015-06-05 ENCOUNTER — Ambulatory Visit (INDEPENDENT_AMBULATORY_CARE_PROVIDER_SITE_OTHER): Payer: 59 | Admitting: Family Medicine

## 2015-06-05 VITALS — BP 132/83 | HR 79 | Wt 286.0 lb

## 2015-06-05 DIAGNOSIS — M79675 Pain in left toe(s): Secondary | ICD-10-CM | POA: Diagnosis not present

## 2015-06-05 DIAGNOSIS — M7732 Calcaneal spur, left foot: Secondary | ICD-10-CM | POA: Diagnosis not present

## 2015-06-05 MED ORDER — OXYCODONE HCL 5 MG PO TABS: 5.0000 mg | ORAL_TABLET | Freq: Four times a day (QID) | ORAL | Status: AC | PRN

## 2015-06-05 NOTE — Assessment & Plan Note (Signed)
Gout and fracture. Gout is partially treated. Continue colchicine. Use oxycodone sparingly. Return in 2 weeks for recheck.

## 2015-06-05 NOTE — Patient Instructions (Signed)
Thank you for coming in today. Continue the boot.  Continue colchicine.  Wean oxycodone.  Return in 2 weeks.

## 2015-06-05 NOTE — Progress Notes (Signed)
Thomas Pineda is a 44 y.o. male who presents to [facility] [place]: [facility] today for follow-up left great toe. Patient was seen on November 20 for left great toe pain. He was thought to have gout. History of colchicine which has helped a lot. He continues to have mild to moderate pain in his left great toe. He uses oxycodone intermittently. He is able to work using oxycodone and the cam walker boot. X-ray did show a tiny avulsion fracture at the distal phalanx of the great toe.   Past Medical History  Diagnosis Date  . Abscess of buttock   . Abscessed tooth   . Peptic ulcer   . Hypertension   . Palpitations   . Skin abnormalities    Past Surgical History  Procedure Laterality Date  . Hand surgery  1999    Right  . Growth excision  07/21/11    RFA   Social History  Substance Use Topics  . Smoking status: Former Smoker    Quit date: 07/29/2013  . Smokeless tobacco: Not on file  . Alcohol Use: Yes     Comment: 2-3 beers per day   family history includes Alcohol abuse in his father; Brain cancer in his paternal aunt; Breast cancer in an other family member; Diabetes in an other family member; Heart disease in his father; Hypertension in his father; Renal cancer in his father; Throat cancer in an other family member.  ROS as above Medications: Current Outpatient Prescriptions  Medication Sig Dispense Refill  . Colchicine 0.6 MG CAPS Take 0.6 mg by mouth daily. 30 capsule 1  . colchicine 0.6 MG tablet Take 1 tablet (0.6 mg total) by mouth daily. 30 tablet 2  . oxyCODONE (OXY IR/ROXICODONE) 5 MG immediate release tablet Take 1-2 tablets (5-10 mg total) by mouth every 6 (six) hours as needed for severe pain. 60 tablet 0   No current facility-administered medications for this visit.   Allergies  Allergen Reactions  . Penicillins      Exam:  BP 132/83 mmHg  Pulse 79  Wt 286 lb (129.729 kg) Gen: Well NAD Left foot: Still swollen but much less erythematous  first toe. Mildly tender first MTP. Interphalangeal joint is mildly tender also. Motion is diminished because of pain. Capillary refill and sensation are intact distally. Pulses are intact at the foot.  X-ray left foot preliminary read: Healing avulsion fx.  Awaiting formal radiology review.  No results found for this or any previous visit (from the past 24 hour(s)). No results found.   Please see individual assessment and plan sections.

## 2015-06-08 NOTE — Progress Notes (Signed)
Quick Note:  Now the radiologists are not sure about a fracture. Follow up like we discussed. ______

## 2015-06-19 ENCOUNTER — Ambulatory Visit (INDEPENDENT_AMBULATORY_CARE_PROVIDER_SITE_OTHER): Payer: 59 | Admitting: Family Medicine

## 2015-06-19 DIAGNOSIS — I10 Essential (primary) hypertension: Secondary | ICD-10-CM

## 2015-06-20 NOTE — Progress Notes (Signed)
No show

## 2015-06-26 ENCOUNTER — Other Ambulatory Visit: Payer: Self-pay | Admitting: Family Medicine

## 2015-06-26 DIAGNOSIS — M79675 Pain in left toe(s): Secondary | ICD-10-CM

## 2015-06-26 MED ORDER — COLCHICINE 0.6 MG PO TABS: 0.6000 mg | ORAL_TABLET | Freq: Every day | ORAL | Status: DC

## 2015-07-01 ENCOUNTER — Ambulatory Visit (INDEPENDENT_AMBULATORY_CARE_PROVIDER_SITE_OTHER): Payer: 59 | Admitting: Family Medicine

## 2015-07-01 DIAGNOSIS — M79675 Pain in left toe(s): Secondary | ICD-10-CM

## 2015-07-02 NOTE — Progress Notes (Signed)
No show

## 2015-08-13 ENCOUNTER — Emergency Department (INDEPENDENT_AMBULATORY_CARE_PROVIDER_SITE_OTHER)
Admission: EM | Admit: 2015-08-13 | Discharge: 2015-08-13 | Disposition: A | Discharge status: Home / Self Care | Payer: 59 | Source: Home / Self Care | Attending: Family Medicine | Admitting: Family Medicine

## 2015-08-13 ENCOUNTER — Encounter: Payer: Self-pay | Admitting: *Deleted

## 2015-08-13 DIAGNOSIS — Y990 Civilian activity done for income or pay: Secondary | ICD-10-CM | POA: Diagnosis not present

## 2015-08-13 DIAGNOSIS — S61210A Laceration without foreign body of right index finger without damage to nail, initial encounter: Secondary | ICD-10-CM

## 2015-08-13 HISTORY — DX: Gout, unspecified: M10.9

## 2015-08-13 MED ORDER — IBUPROFEN 600 MG PO TABS: 600.0000 mg | ORAL_TABLET | Freq: Four times a day (QID) | ORAL | Status: DC | PRN

## 2015-08-13 MED ORDER — TRAMADOL HCL 50 MG PO TABS: 50.0000 mg | ORAL_TABLET | Freq: Four times a day (QID) | ORAL | Status: DC | PRN

## 2015-08-13 MED ORDER — DOXYCYCLINE HYCLATE 100 MG PO CAPS: 100.0000 mg | ORAL_CAPSULE | Freq: Two times a day (BID) | ORAL | Status: DC

## 2015-08-13 NOTE — ED Notes (Signed)
Pt c/o RT 2nd finger laceration x 1530 today. He reports that he was at work today and cut his finger on a piece of metal under a sink.  Tdap 3 years ago.

## 2015-08-13 NOTE — ED Provider Notes (Signed)
CSN: [id number]     Arrival date & time 08/13/15  1709 History   First MD Initiated Contact with Patient 08/13/15 1731     Chief Complaint  Patient presents with  . Extremity Laceration   (Consider location/radiation/quality/duration/timing/severity/associated sxs/prior Treatment) HPI  Pt is a 45yo male presenting to [facility] with a laceration to his Right index finger that occurred around 15:30 today while at work. He states he was working under a sink, moved his Right hand back and accidentally cut it on a jagged piece of metal.  Bleeding controlled PTA as pt applied a bandage at work.  Pain is 5/10 aching and throbbing now that bandage is removed. Pain is worse with movement. He does not take blood thinners. No other injuries.  Last tetanus- 3 years ago.   Past Medical History  Diagnosis Date  . Abscess of buttock   . Abscessed tooth   . Peptic ulcer   . Hypertension   . Palpitations   . Skin abnormalities   . Gout    Past Surgical History  Procedure Laterality Date  . Hand surgery  1999    Right  . Growth excision  07/21/11    RFA   Family History  Problem Relation Age of Onset  . Hypertension Father   . Renal cancer Father   . Alcohol abuse Father   . Heart disease Father     atrial fibrillation  . Diabetes    . Throat cancer      GF  . Breast cancer      GM  . Brain cancer Paternal Aunt    Social History  Substance Use Topics  . Smoking status: Former Smoker    Quit date: 07/29/2013  . Smokeless tobacco: None  . Alcohol Use: Yes     Comment: 2-3 beers per day    Review of Systems  Musculoskeletal: Positive for myalgias and arthralgias.       Right index finger  Skin: Positive for wound. Negative for color change.  Neurological: Negative for weakness and numbness.    Allergies  Penicillins  Home Medications   Prior to Admission medications   Medication Sig Start Date End Date Taking? Authorizing Provider  colchicine 0.6 MG tablet Take 0.6 mg by mouth  daily.   Yes Historical Provider, MD  doxycycline (VIBRAMYCIN) 100 MG capsule Take 1 capsule (100 mg total) by mouth 2 (two) times daily. One po bid x 7 days 08/13/15   Noland Fordyce, PA-C  ibuprofen (ADVIL,MOTRIN) 600 MG tablet Take 1 tablet (600 mg total) by mouth every 6 (six) hours as needed. 08/13/15   Noland Fordyce, PA-C  traMADol (ULTRAM) 50 MG tablet Take 1 tablet (50 mg total) by mouth every 6 (six) hours as needed. 08/13/15   Noland Fordyce, PA-C   Meds Ordered and Administered this Visit  Medications - No data to display  BP 143/89 mmHg  Pulse 80  Temp(Src) 97.7 F (36.5 C) (Oral)  Resp 16  Ht 6\' 2"  (1.88 m)  Wt 294 lb (133.358 kg)  BMI 37.73 kg/m2  SpO2 95% No data found.   Physical Exam  Constitutional: He is oriented to person, place, and time. He appears well-developed and well-nourished.  HENT:  Head: Normocephalic and atraumatic.  Eyes: EOM are normal.  Neck: Normal range of motion.  Cardiovascular: Normal rate.   Pulmonary/Chest: Effort normal.  Musculoskeletal: Normal range of motion. He exhibits tenderness. He exhibits no edema.  Right index finger: full ROM. Mild tenderness  to proximal phalanx (see skin exam)  Neurological: He is alert and oriented to person, place, and time.  Right index finger: normal sensation  Skin: Skin is warm and dry.  Right index finger, dorsal aspect, proximal phalanx: 3cm superficial laceration. No active bleeding. No foreign body seen or palpated.    Psychiatric: He has a normal mood and affect. His behavior is normal.  Nursing note and vitals reviewed.   ED Course  Procedures (including critical care time)  Labs Review Labs Reviewed - No data to display  Imaging Review No results found.  Would cleaned with warm water a Hibiclens. No active bleeding. Wound on proximal phalanx of Right index finger. 2 steri-strips placed and bandage applied.  Last tetanus was 3 years ago.      MDM   1. Laceration of right index finger  w/o foreign body w/o damage to nail, initial encounter   2. Work related injury    Pt presenting to [facility] with a superficial laceration to Right index finger. Closed with steri-strips and bandaged.  Tetanus: UTD  Pt declined acetaminophen or ibuprofen in UC  Rx: tramadol, ibuprofen and doxycycline F/u with PCP in 4-5 days if wound not improving, sooner if worsening. Home care instructions provided. Patient verbalized understanding and agreement with treatment plan.    Noland Fordyce, PA-C 08/13/15 1849

## 2015-08-13 NOTE — Discharge Instructions (Signed)
°  Tramadol is strong pain medication. While taking, do not drink alcohol, drive, or perform any other activities that requires focus while taking these medications.  ° °

## 2016-02-16 ENCOUNTER — Telehealth: Payer: Self-pay | Admitting: Family Medicine

## 2016-02-16 NOTE — Telephone Encounter (Signed)
Wife called.  Thomas Pineda wants to be referred for his Diverticulitis.  Wife's telephone contact is, [phone].

## 2016-02-16 NOTE — Telephone Encounter (Signed)
Why? Is he still having pain? Is he due for colonoscopy?

## 2016-02-17 NOTE — Telephone Encounter (Signed)
Left message on patient vm asking to call the office back to let us know why the referral is needed. Elijah Michaelis,CMA

## 2016-02-18 ENCOUNTER — Telehealth: Payer: Self-pay | Admitting: *Deleted

## 2016-02-18 NOTE — Telephone Encounter (Signed)
lvm informing pt's wife that he was seen for the issue of diverticulitis in January 2016 by Dr. Ileene Rubens at which time he advised him to f/u with Dr. Madilyn Fireman in 1 week. He has not been seen by Dr. Madilyn Fireman for this issue and if he is still having an issue with this he should call and schedule an appt with her for this.Audelia Hives [place]

## 2016-02-20 ENCOUNTER — Encounter: Payer: Self-pay | Admitting: Family Medicine

## 2016-02-20 ENCOUNTER — Ambulatory Visit (INDEPENDENT_AMBULATORY_CARE_PROVIDER_SITE_OTHER): Payer: 59

## 2016-02-20 ENCOUNTER — Ambulatory Visit (INDEPENDENT_AMBULATORY_CARE_PROVIDER_SITE_OTHER): Payer: 59 | Admitting: Family Medicine

## 2016-02-20 VITALS — BP 118/76 | HR 75 | Temp 98.7°F | Ht 74.0 in | Wt 275.0 lb

## 2016-02-20 DIAGNOSIS — K5792 Diverticulitis of intestine, part unspecified, without perforation or abscess without bleeding: Secondary | ICD-10-CM | POA: Diagnosis not present

## 2016-02-20 DIAGNOSIS — R1032 Left lower quadrant pain: Secondary | ICD-10-CM | POA: Diagnosis not present

## 2016-02-20 DIAGNOSIS — Z23 Encounter for immunization: Secondary | ICD-10-CM | POA: Diagnosis not present

## 2016-02-20 MED ORDER — AMOXICILLIN-POT CLAVULANATE 875-125 MG PO TABS: 1.0000 | ORAL_TABLET | Freq: Two times a day (BID) | ORAL | 0 refills | Status: DC

## 2016-02-20 MED ORDER — IOPAMIDOL (ISOVUE-300) INJECTION 61%
100.0000 mL | Freq: Once | INTRAVENOUS | Status: AC | PRN
  Administered 2016-02-20: 100 mL via INTRAVENOUS

## 2016-02-20 NOTE — Telephone Encounter (Addendum)
Called patient back. He has an appoint scheduled for today  @ 1:45 for Diverticulitis and he stated that he will discuss it then. Elijha Dedman,CMA

## 2016-02-20 NOTE — Progress Notes (Signed)
Subjective:    CC: diverticulitis  HPI: C/O abdominal pain x 1 months.  Took flagyl and cipro about 3 weeks ago and didn't really help.  Started getting worse about a week ago.  Says no vomiting. + nausea.  No fever/chillls. Has been having soft stools. Last sat saw blood in stool last weekend and was bright red.  Last episode was in January 2016 and he saw my partners. He did have a CT at that time to confirm an inflamed 5-6 cm segment of the proximal sigmoid colon compatible with acute diverticulitis. They had actually recommended follow-up CT to evaluate for neoplasm.He has frequent loose stools but this is chronic.  BP 118/76   Pulse 75   Temp 98.7 F (37.1 C)   Ht 6\' 2"  (1.88 m)   Wt 275 lb (124.7 kg)   SpO2 98%   BMI 35.31 kg/m     Allergies  Allergen Reactions  . Penicillins     Past Medical History:  Diagnosis Date  . Abscess of buttock   . Abscessed tooth   . Gout   . Hypertension   . Palpitations   . Peptic ulcer   . Skin abnormalities     Past Surgical History:  Procedure Laterality Date  . growth excision  07/21/11   RFA  . HAND SURGERY  1999   Right    Social History   Social History  . Marital status: Married    Spouse name: N/A  . Number of children: N/A  . Years of education: N/A   Occupational History  .  Pharmacist, community   Social History Main Topics  . Smoking status: Former Smoker    Quit date: 07/29/2013  . Smokeless tobacco: Not on file  . Alcohol use Yes     Comment: 2-3 beers per day  . Drug use:     Types: Marijuana     Comment: Marijuana  . Sexual activity: Yes    Partners: Female   Other Topics Concern  . Not on file   Social History Narrative   Does not get regular exercise.      Family History  Problem Relation Age of Onset  . Hypertension Father   . Renal cancer Father   . Alcohol abuse Father   . Heart disease Father     atrial fibrillation  . Diabetes    . Throat cancer      GF  . Breast  cancer      GM  . Brain cancer Paternal Aunt     Outpatient Encounter Prescriptions as of 02/20/2016  Medication Sig  . amoxicillin-clavulanate (AUGMENTIN) 875-125 MG tablet Take 1 tablet by mouth 2 (two) times daily.  . [DISCONTINUED] colchicine 0.6 MG tablet Take 0.6 mg by mouth daily.  . [DISCONTINUED] doxycycline (VIBRAMYCIN) 100 MG capsule Take 1 capsule (100 mg total) by mouth 2 (two) times daily. One po bid x 7 days  . [DISCONTINUED] ibuprofen (ADVIL,MOTRIN) 600 MG tablet Take 1 tablet (600 mg total) by mouth every 6 (six) hours as needed.  . [DISCONTINUED] traMADol (ULTRAM) 50 MG tablet Take 1 tablet (50 mg total) by mouth every 6 (six) hours as needed.   No facility-administered encounter medications on file as of 02/20/2016.     Review of Systems: No fevers, chills, night sweats, weight loss, chest pain, or shortness of breath.   Objective:    General: Well Developed, well nourished, and in no acute distress.  Neuro: Alert and oriented x3, extra-ocular muscles intact, sensation grossly intact.  HEENT: Normocephalic, atraumatic  Skin: Warm and dry, no rashes. Cardiac: Regular rate and rhythm, no murmurs rubs or gallops, no lower extremity edema.  Respiratory: Clear to auscultation bilaterally. Not using accessory muscles, speaking in full sentences. Abd: soft extremely tender in the left lower quadrant. I palpated the right lower quadrant it radiated over to the left. He did have guarding in the left lower quadrant with rebound tenderness.   Impression and Recommendations:    Left lower quadrant pain-likely acute diverticulitis. Very concerning for abscess. Recommend stat CT abdomen and pelvis with contrast for further evaluation.  CT showed acute diverticulitis of the sigmoid colon with no abscess. Since he Artie had a course of Cipro and Flagyl and did not improve. Will treat with Augmentin. Follow-up in one week to reexamine abdomen.

## 2016-02-21 LAB — COMPLETE METABOLIC PANEL WITH GFR
ALBUMIN: 4.3 g/dL (ref 3.6–5.1)
ALK PHOS: 79 U/L (ref 40–115)
ALT: 19 U/L (ref 9–46)
AST: 13 U/L (ref 10–40)
BILIRUBIN TOTAL: 0.5 mg/dL (ref 0.2–1.2)
BUN: 18 mg/dL (ref 7–25)
CO2: 25 mmol/L (ref 20–31)
Calcium: 9.2 mg/dL (ref 8.6–10.3)
Chloride: 102 mmol/L (ref 98–110)
Creat: 1.01 mg/dL (ref 0.60–1.35)
GFR, EST NON AFRICAN AMERICAN: 89 mL/min (ref >=60)
GFR, Est African American: >89 mL/min (ref >=60)
Glucose, Bld: 79 mg/dL (ref 65–99)
POTASSIUM: 4.4 mmol/L (ref 3.5–5.3)
Sodium: 140 mmol/L (ref 135–146)
Total Protein: 6.7 g/dL (ref 6.1–8.1)

## 2016-02-21 LAB — CBC WITH DIFFERENTIAL/PLATELET
BASOS ABS: 0 {cells}/uL (ref 0–200)
Basophils Relative: 0 %
EOS PCT: 3 %
Eosinophils Absolute: 366 cells/uL (ref 15–500)
HCT: 42.3 % (ref 38.5–50.0)
HEMOGLOBIN: 14.2 g/dL (ref 13.2–17.1)
Lymphocytes Relative: 27 %
Lymphs Abs: 3294 cells/uL (ref 850–3900)
MCH: 29.2 pg (ref 27.0–33.0)
MCHC: 33.6 g/dL (ref 32.0–36.0)
MCV: 86.9 fL (ref 80.0–100.0)
MONOS PCT: 6 %
MPV: 9.9 fL (ref 7.5–12.5)
Monocytes Absolute: 732 cells/uL (ref 200–950)
NEUTROS ABS: 7808 {cells}/uL — AB (ref 1500–7800)
NEUTROS PCT: 64 %
PLATELETS: 286 10*3/uL (ref 140–400)
RBC: 4.87 MIL/uL (ref 4.20–5.80)
RDW: 13.2 % (ref 11.0–15.0)
WBC: 12.2 10*3/uL — ABNORMAL HIGH (ref 3.8–10.8)

## 2016-02-25 ENCOUNTER — Encounter: Payer: Self-pay | Admitting: Family Medicine

## 2016-02-25 ENCOUNTER — Ambulatory Visit (INDEPENDENT_AMBULATORY_CARE_PROVIDER_SITE_OTHER): Payer: 59 | Admitting: Family Medicine

## 2016-02-25 VITALS — BP 124/66 | HR 75 | Resp 16 | Ht 74.0 in | Wt 276.0 lb

## 2016-02-25 DIAGNOSIS — K5792 Diverticulitis of intestine, part unspecified, without perforation or abscess without bleeding: Secondary | ICD-10-CM | POA: Diagnosis not present

## 2016-02-25 NOTE — Progress Notes (Signed)
Subjective:    CC: Abd Pain x 1 months.    HPI:  There for follow-up for acute diverticulitis. He actually had a round of Flagyl and Cipro approximately 4 weeks ago. He came in because he was still having persistent pain. We did do a CT that confirmed acute diverticulitis. Started him on Augmentin 5 days ago. He says he still needed but not really feeling a lot better. He still having some abdominal cramping and loose stools. He's noticed that the swelling and fullness sensation is much improved. He is not able to have a bowel movement wears before he actually felt very constipated like nothing was moving. He says he can still fill it  moving through and it is uncomfortable but not nearly as the pain he was having before. Chills or sweats.  Past medical history, Surgical history, Family history not pertinant except as noted below, Social history, Allergies, and medications have been entered into the medical record, reviewed, and corrections made.   Review of Systems: No fevers, chills, night sweats, weight loss, chest pain, or shortness of breath.   Objective:    General: Well Developed, well nourished, and in no acute distress.  Neuro: Alert and oriented x3, extra-ocular muscles intact, sensation grossly intact.  HEENT: Normocephalic, atraumatic  Skin: Warm and dry, no rashes. Cardiac: Regular rate and rhythm, no murmurs rubs or gallops, no lower extremity edema.  Respiratory: Clear to auscultation bilaterally. Not using accessory muscles, speaking in full sentences. Abd:  Soft, tender in the left lower quadrant around the umbilicus over. No rebound or guarding today.  Impression and Recommendations:    Acute diverticulitis-he swelling or 56% improved per his report. Continue completing the antibiotic. If we need to add an additional 4 days if he still having some symptoms then he will give [place] a call. Continue with advancing diet as tolerated. Make sure hydrating well.

## 2016-03-02 ENCOUNTER — Telehealth: Payer: Self-pay | Admitting: Family Medicine

## 2016-03-02 DIAGNOSIS — K5732 Diverticulitis of large intestine without perforation or abscess without bleeding: Secondary | ICD-10-CM

## 2016-03-02 NOTE — Telephone Encounter (Signed)
Wife called.  Med for Diverticulitis is not working.

## 2016-03-02 NOTE — Telephone Encounter (Signed)
Thomas Pineda, Jadon's wife, called and reports Thomas Pineda is still having abdominal pain. The pain started back on Saturday but it isn't as bad. Denies fever, chills or sweats. Please advise.

## 2016-03-02 NOTE — Telephone Encounter (Signed)
Needs referral to GI. See if they can see him this week.

## 2016-03-03 ENCOUNTER — Encounter: Payer: Self-pay | Admitting: Gastroenterology

## 2016-03-03 NOTE — Telephone Encounter (Signed)
Patient's wife advised and referral sent.

## 2016-05-11 ENCOUNTER — Encounter: Payer: Self-pay | Admitting: Gastroenterology

## 2016-05-11 ENCOUNTER — Ambulatory Visit (INDEPENDENT_AMBULATORY_CARE_PROVIDER_SITE_OTHER): Payer: 59 | Admitting: Gastroenterology

## 2016-05-11 ENCOUNTER — Ambulatory Visit (INDEPENDENT_AMBULATORY_CARE_PROVIDER_SITE_OTHER): Payer: 59 | Admitting: Sports Medicine

## 2016-05-11 ENCOUNTER — Ambulatory Visit (INDEPENDENT_AMBULATORY_CARE_PROVIDER_SITE_OTHER): Payer: 59

## 2016-05-11 ENCOUNTER — Encounter: Payer: Self-pay | Admitting: Sports Medicine

## 2016-05-11 VITALS — BP 120/80 | HR 84 | Ht 72.5 in | Wt 290.0 lb

## 2016-05-11 DIAGNOSIS — R933 Abnormal findings on diagnostic imaging of other parts of digestive tract: Secondary | ICD-10-CM | POA: Diagnosis not present

## 2016-05-11 DIAGNOSIS — M7042 Prepatellar bursitis, left knee: Secondary | ICD-10-CM

## 2016-05-11 DIAGNOSIS — M25462 Effusion, left knee: Secondary | ICD-10-CM | POA: Diagnosis not present

## 2016-05-11 DIAGNOSIS — K5732 Diverticulitis of large intestine without perforation or abscess without bleeding: Secondary | ICD-10-CM

## 2016-05-11 MED ORDER — NA SULFATE-K SULFATE-MG SULF 17.5-3.13-1.6 GM/177ML PO SOLN: 1.0000 | Freq: Once | ORAL | 0 refills | Status: AC

## 2016-05-11 MED ORDER — NAPROXEN 500 MG PO TABS: 500.0000 mg | ORAL_TABLET | Freq: Two times a day (BID) | ORAL | 3 refills | Status: DC

## 2016-05-11 NOTE — Progress Notes (Signed)
   Subjective:    I'm seeing this patient as a consultation for:  Dr. 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 Pain is localized, no radiation, no constitutional symptoms, no trauma.  Past medical history:  Negative.  See flowsheet/record as well for more information.  Surgical history: Negative.  See flowsheet/record as well for more information.  Family history: Negative.  See flowsheet/record as well for more information.  Social history: Negative.  See flowsheet/record as well for more information.  Allergies, and medications have been entered into the medical record, reviewed, and no changes needed.   Review of Systems: No headache, visual changes, nausea, vomiting, diarrhea, constipation, dizziness, abdominal pain, skin rash, fevers, chills, night sweats, weight loss, swollen lymph nodes, body aches, joint swelling, muscle aches, chest pain, shortness of breath, mood changes, visual or auditory hallucinations.   Objective:   General: Well Developed, well nourished, and in no acute distress.  Neuro/Psych: Alert and oriented x3, extra-ocular muscles intact, able to move all 4 extremities, sensation grossly intact. Skin: Warm and dry, no rashes noted.  Respiratory: Not using accessory muscles, speaking in full sentences, trachea midline.  Cardiovascular: Pulses palpable, no extremity edema. Abdomen: Does not appear distended. Left Knee: Visibly swollen with a palpably distended prepatellar bursa ROM normal in flexion and extension and lower leg rotation. Ligaments with solid consistent endpoints including ACL, PCL, LCL, MCL. Negative Mcmurray's and provocative meniscal tests. Non painful patellar compression. Patellar and quadriceps tendons unremarkable. Hamstring and quadriceps strength is  normal.   Procedure: Real-time Ultrasound Guided aspiration/injection of left prepatellar bursa Device: GE Logiq E  Verbal informed consent obtained.  Time-out conducted.  Noted no overlying erythema, induration, or other signs of local infection.  Skin prepped in a sterile fashion.  Local anesthesia: Topical Ethyl chloride.  With sterile technique and under real time ultrasound guidance:  Aspirated 10 mL serosanguineous fluid, syringe switched and 1 mL kenalog 40, 1 mL lidocaine injected easily. Completed without difficulty  Pain immediately resolved suggesting accurate placement of the medication.  Advised to call if fevers/chills, erythema, induration, drainage, or persistent bleeding.  Images permanently stored and available for review in the ultrasound unit.  Impression: Technically successful ultrasound guided injection.  The knee was strapped with compressive dressing  Impression and Recommendations:   This case required medical decision making of moderate complexity.  Prepatellar bursitis, left knee Traumatic prepatellar bursitis, aspiration and injection. X-rays. Strapped with compressive dressing. 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 Return in one month.

## 2016-05-11 NOTE — Patient Instructions (Addendum)
You will be set up for a colonoscopy/EGD for abnormal CT scan colon and stomach.  Please call here if any problems before then.

## 2016-05-11 NOTE — Progress Notes (Signed)
HPI: This is a   very pleasant very post 45 year old man  who was referred to me by Hali Marry, *  to evaluate  recent acute diverticulitis, abnormal CT scan of colon and stomach .    Chief complaint is recent acute diverticulitis, abnormal CT scan of colon and stomach   CT scan 06/2014: 1. Thickened and inflamed 5-6 cm segment of the proximal sigmoid colon compatible with acute diverticulitis, however, follow-up is recommended to exclude neoplasm which can have a similar appearance and presentation. 2. No abscess or complicating features identified. 3. Hepatic steatosis suspected.   CT scan 01/2016: Sigmoid diverticulitis without evidence of abscess or perforation.  Suspicious findings along the posterior aspect of the gastric fundus. Nonemergent Dual contrast upper GI is recommended when able for further evaluation.  (from body of report:.Marland KitchenMarland KitchenPosteriorly along the gastric fundus best seen on image number 18 of series 2 there is a soft tissue filling defect within the stomach. It has acute angles with the gastric wall and may represent a focal mass. This may represent ingested material. Nonemergent upper GI is recommended when able).   No other focal abnormality is noted  He thinks he suffered from this intermittently for many years.   Intermittent left lower quadrant pains. He has only had antibiotic course for diverticulitis twice. Those correlate with CT scans documented above.  This past August, the pain was severe, ripping, no BM for several days.  He does not recall fever chills.  Cipro/flagyl changed eventuallly to augmentine.  He still intermittently has left low flank pains.  Overall weight yo-yos.    This past August he did have some red blood.  His usual bowel pattern is once daily soft BM, brown.   Dad may have had diverticultitis.  No FH of colon cancer.  Review of systems: Pertinent positive and negative review of systems were noted in the above  HPI section. Complete review of systems was performed and was otherwise normal.   Past Medical History:  Diagnosis Date  . Abscess of buttock   . Abscessed tooth   . Alcoholism ([place])   . Anxiety   . Depression   . Diverticulitis   . GERD (gastroesophageal reflux disease)   . Gout   . HLD (hyperlipidemia)   . Hypertension   . Palpitations   . Peptic ulcer   . Skin abnormalities     Past Surgical History:  Procedure Laterality Date  . HAND SURGERY Right 1999  . SQUAMOUS CELL CARCINOMA EXCISION Right 07/21/2011   forearm    No current outpatient prescriptions on file.   No current facility-administered medications for this visit.     Allergies as of 05/11/2016 - Review Complete 05/11/2016  Allergen Reaction Noted  . Penicillins  06/30/2010    Family History  Problem Relation Age of Onset  . Hypertension Father   . Renal cancer Father   . Alcohol abuse Father   . Heart disease Father     atrial fibrillation  . Diabetes Father   . Brain cancer Paternal Aunt   . Diabetes Mother   . Breast cancer Maternal Grandmother   . Ulcerative colitis Maternal Grandmother   . Liver disease Maternal Grandmother   . Diabetes Maternal Grandfather   . Heart disease Maternal Grandfather   . Throat cancer Paternal Grandfather   . Lung cancer Paternal Grandfather   . Colon polyps Maternal Aunt   . Irritable bowel syndrome Cousin     paternal    Social  History   Social History  . Marital status: Married    Spouse name: N/A  . Number of children: 1  . Years of education: N/A   Occupational History  . plumber Pharmacist, community   Social History Main Topics  . Smoking status: Former Smoker    Quit date: 07/29/2013  . Smokeless tobacco: Never Used     Comment: uses e-cig  . Alcohol use Yes     Comment: occasional-quit but still have a couple  . Drug use:     Types: Marijuana     Comment: Marijuana  . Sexual activity: Yes    Partners: Female   Other  Topics Concern  . Not on file   Social History Narrative   Does not get regular exercise.       Physical Exam: BP 120/80 (BP Location: Left Arm, Patient Position: Sitting, Cuff Size: Normal)   Pulse 84   Ht 6' 0.5" (1.842 m) Comment: height measured without shoes  Wt 290 lb (131.5 kg)   BMI 38.79 kg/m  Constitutional: generally well-appearing Psychiatric: alert and oriented x3 Eyes: extraocular movements intact Mouth: oral pharynx moist, no lesions Neck: supple no lymphadenopathy Cardiovascular: heart regular rate and rhythm Lungs: clear to auscultation bilaterally Abdomen: soft, nontender, nondistended, no obvious ascites, no peritoneal signs, normal bowel sounds Extremities: no lower extremity edema bilaterally Skin: no lesions on visible extremities   Assessment and plan: 45 y.o. male with  abnormal CT scan of colon and stomach, recurrent diverticulitis  These episodes certainly may have been from acute diverticulitis. I recommended we proceed with colonoscopy at his soonest convenience to rule out other potential causes such as malignancy which can sometimes mimic diverticulitis. His abdominal CT scan showed a possible mass in his stomach as well. I will check this area at the same time as his colonoscopy by doing upper endoscopy. I see no reason for any further blood tests or imaging studies prior to then.     Owens Loffler, MD [facility] 05/11/2016, 11:18 AM  Cc: Hali Marry, *

## 2016-05-11 NOTE — Assessment & Plan Note (Signed)
Traumatic prepatellar bursitis, aspiration and injection. X-rays. Strapped with compressive dressing. 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 Return in one month.

## 2016-05-18 ENCOUNTER — Encounter: Payer: Self-pay | Admitting: Gastroenterology

## 2016-05-18 ENCOUNTER — Ambulatory Visit (AMBULATORY_SURGERY_CENTER): Payer: 59 | Admitting: Gastroenterology

## 2016-05-18 VITALS — BP 135/80 | HR 60 | Temp 98.6°F | Resp 10 | Ht 72.0 in | Wt 290.0 lb

## 2016-05-18 DIAGNOSIS — R198 Other specified symptoms and signs involving the digestive system and abdomen: Secondary | ICD-10-CM

## 2016-05-18 DIAGNOSIS — K5730 Diverticulosis of large intestine without perforation or abscess without bleeding: Secondary | ICD-10-CM | POA: Diagnosis not present

## 2016-05-18 DIAGNOSIS — R933 Abnormal findings on diagnostic imaging of other parts of digestive tract: Secondary | ICD-10-CM | POA: Diagnosis present

## 2016-05-18 DIAGNOSIS — D123 Benign neoplasm of transverse colon: Secondary | ICD-10-CM | POA: Diagnosis not present

## 2016-05-18 MED ORDER — SODIUM CHLORIDE 0.9 % IV SOLN: 500.0000 mL | INTRAVENOUS | Status: DC

## 2016-05-18 NOTE — Progress Notes (Signed)
Patient awakening,vss,report to rn 

## 2016-05-18 NOTE — Progress Notes (Signed)
Called to room to assist during endoscopic procedure.  Patient ID and intended procedure confirmed with present staff. Received instructions for my participation in the procedure from the performing physician.  

## 2016-05-18 NOTE — Patient Instructions (Addendum)
YOU HAD AN ENDOSCOPIC PROCEDURE TODAY AT THE [facility]:   Refer to the procedure report that was given to you for any specific questions about what was found during the examination.  If the procedure report does not answer your questions, please call your gastroenterologist to clarify.  If you requested that your care partner not be given the details of your procedure findings, then the procedure report has been included in a sealed envelope for you to review at your convenience later.  YOU SHOULD EXPECT: Some feelings of bloating in the abdomen. Passage of more gas than usual.  Walking can help get rid of the air that was put into your GI tract during the procedure and reduce the bloating. If you had a lower endoscopy (such as a colonoscopy or flexible sigmoidoscopy) you may notice spotting of blood in your stool or on the toilet paper. If you underwent a bowel prep for your procedure, you may not have a normal bowel movement for a few days.  Please Note:  You might notice some irritation and congestion in your nose or some drainage.  This is from the oxygen used during your procedure.  There is no need for concern and it should clear up in a day or so.  SYMPTOMS TO REPORT IMMEDIATELY:   Following lower endoscopy (colonoscopy or flexible sigmoidoscopy):  Excessive amounts of blood in the stool  Significant tenderness or worsening of abdominal pains  Swelling of the abdomen that is new, acute  Fever of 100F or higher   Following upper endoscopy (EGD)  Vomiting of blood or coffee ground material  New chest pain or pain under the shoulder blades  Painful or persistently difficult swallowing  New shortness of breath  Fever of 100F or higher  Black, tarry-looking stools  For urgent or emergent issues, a gastroenterologist can be reached at any hour by calling [phone].   DIET:  We do recommend a small meal at first, but then you may proceed to your regular diet.  Drink  plenty of fluids but you should avoid alcoholic beverages for 24 hours.  ACTIVITY:  You should plan to take it easy for the rest of today and you should NOT DRIVE or use heavy machinery until tomorrow (because of the sedation medicines used during the test).    FOLLOW UP: Our staff will call the number listed on your records the next business day following your procedure to check on you and address any questions or concerns that you may have regarding the information given to you following your procedure. If we do not reach you, we will leave a message.  However, if you are feeling well and you are not experiencing any problems, there is no need to return our call.  We will assume that you have returned to your regular daily activities without incident.  If any biopsies were taken you will be contacted by phone or by letter within the next 1-3 weeks.  Please call us at [phone] if you have not heard about the biopsies in 3 weeks.    SIGNATURES/CONFIDENTIALITY: You and/or your care partner have signed paperwork which will be entered into your electronic medical record.  These signatures attest to the fact that that the information above on your After Visit Summary has been reviewed and is understood.  Full responsibility of the confidentiality of this discharge information lies with you and/or your care-partner.  Thank you for letting us take care of your healthcare needs   today.

## 2016-05-18 NOTE — Op Note (Signed)
[facility] Patient Name: Thomas Pineda Procedure Date: 05/18/2016 1:54 PM MRN: [id number] Endoscopist: Milus Banister , MD Age: 45 Referring MD:  Date of Birth: 10-05-1970 Gender: Male Account #: [id number] Procedure:                Colonoscopy Indications:              Abnormal CT of the GI tract (diverticulitis) Medicines:                Monitored Anesthesia Care Procedure:                Pre-Anesthesia Assessment:                           - Prior to the procedure, a History and Physical                            was performed, and patient medications and                            allergies were reviewed. The patient's tolerance of                            previous anesthesia was also reviewed. The risks                            and benefits of the procedure and the sedation                            options and risks were discussed with the patient.                            All questions were answered, and informed consent                            was obtained. Prior Anticoagulants: The patient has                            taken no previous anticoagulant or antiplatelet                            agents. ASA Grade Assessment: II - A patient with                            mild systemic disease. After reviewing the risks                            and benefits, the patient was deemed in                            satisfactory condition to undergo the procedure.                           After obtaining informed consent, the colonoscope  was passed under direct vision. Throughout the                            procedure, the patient's blood pressure, pulse, and                            oxygen saturations were monitored continuously. The                            Model CF-HQ190L [id number]) scope was introduced                            through the anus and advanced to the the cecum,                            identified by  appendiceal orifice and ileocecal                            valve. The colonoscopy was performed without                            difficulty. The patient tolerated the procedure                            well. The quality of the bowel preparation was                            good. The ileocecal valve, appendiceal orifice, and                            rectum were photographed. Scope In: 1:56:13 PM Scope Out: 2:04:59 PM Scope Withdrawal Time: 0 hours 5 minutes 47 seconds  Total Procedure Duration: 0 hours 8 minutes 46 seconds  Findings:                 A 3 mm polyp was found in the transverse colon. The                            polyp was sessile. The polyp was removed with a                            cold snare. Resection and retrieval were complete.                           Multiple small and large-mouthed diverticula were                            found in the left colon. There was narrowing of the                            colon in association with the diverticular opening.                            There was  evidence of diverticular spasm.                           The exam was otherwise without abnormality on                            direct and retroflexion views. Complications:            No immediate complications. Estimated blood loss:                            None. Estimated Blood Loss:     Estimated blood loss: none. Impression:               - One 3 mm polyp in the transverse colon, removed                            with a cold snare. Resected and retrieved.                           - Diverticulosis in the left colon. There was                            narrowing of the colon in association with the                            diverticular opening. There was evidence of                            diverticular spasm.                           - The examination was otherwise normal on direct                            and retroflexion views. Recommendation:            - Patient has a contact number available for                            emergencies. The signs and symptoms of potential                            delayed complications were discussed with the                            patient. Return to normal activities tomorrow.                            Written discharge instructions were provided to the                            patient.                           - Resume previous diet.                           -  Continue present medications.                           - Repeat colonoscopy in 10 years for screening                            purposes.                           - Please call Dr. Ardis Hughs' office if you feel you                            have diverticulitis again. Milus Banister, MD 05/18/2016 2:07:53 PM This report has been signed electronically.

## 2016-05-18 NOTE — Op Note (Signed)
[facility] Patient Name: Thomas Pineda Procedure Date: 05/18/2016 1:53 PM MRN: [id number] Endoscopist: Milus Banister , MD Age: 45 Referring MD:  Date of Birth: 1970-07-26 Gender: Male Account #: [id number] Procedure:                Upper GI endoscopy Indications:              Abnormal CT of the GI tract (? mass in stomach) Medicines:                Monitored Anesthesia Care Procedure:                Pre-Anesthesia Assessment:                           - Prior to the procedure, a History and Physical                            was performed, and patient medications and                            allergies were reviewed. The patient's tolerance of                            previous anesthesia was also reviewed. The risks                            and benefits of the procedure and the sedation                            options and risks were discussed with the patient.                            All questions were answered, and informed consent                            was obtained. Prior Anticoagulants: The patient has                            taken no previous anticoagulant or antiplatelet                            agents. ASA Grade Assessment: II - A patient with                            mild systemic disease. After reviewing the risks                            and benefits, the patient was deemed in                            satisfactory condition to undergo the procedure.                           After obtaining informed consent, the endoscope was  passed under direct vision. Throughout the                            procedure, the patient's blood pressure, pulse, and                            oxygen saturations were monitored continuously. The                            Model GIF-HQ190 [id number]) scope was introduced                            through the mouth, and advanced to the second part                            of  duodenum. The upper GI endoscopy was                            accomplished without difficulty. The patient                            tolerated the procedure well. Scope In: Scope Out: Findings:                 The esophagus was normal.                           The stomach was normal.                           The examined duodenum was normal. Complications:            No immediate complications. Estimated blood loss:                            None. Estimated Blood Loss:     Estimated blood loss: none. Impression:               - Normal esophagus.                           - Normal stomach.                           - Normal examined duodenum.                           - No specimens collected. Recommendation:           - Patient has a contact number available for                            emergencies. The signs and symptoms of potential                            delayed complications were discussed with the  patient. Return to normal activities tomorrow.                            Written discharge instructions were provided to the                            patient.                           - Resume previous diet.                           - Continue present medications.                           - No repeat upper endoscopy. Milus Banister, MD 05/18/2016 2:15:05 PM This report has been signed electronically.

## 2016-05-19 ENCOUNTER — Telehealth: Payer: Self-pay | Admitting: *Deleted

## 2016-05-19 NOTE — Telephone Encounter (Signed)
  Follow up Call-  Call back number 05/18/2016  Post procedure Call Back phone  # [phone]  Permission to leave phone message No  Some recent data might be hidden     Patient questions:  Do you have a fever, pain , or abdominal swelling? Yes.   Pain Score  3 *  Have you tolerated food without any problems? Yes.    Have you been able to return to your normal activities? Yes.    Do you have any questions about your discharge instructions: Diet   Yes.   Medications  No. Follow up visit  No.  Do you have questions or concerns about your Care? Yes.    Actions: * If pain score is 4 or above: No action needed, pain <4.

## 2016-05-24 ENCOUNTER — Encounter: Payer: Self-pay | Admitting: Gastroenterology

## 2016-06-08 ENCOUNTER — Ambulatory Visit: Payer: 59 | Admitting: Sports Medicine

## 2016-07-01 ENCOUNTER — Ambulatory Visit: Payer: 59 | Admitting: Sports Medicine

## 2016-07-09 ENCOUNTER — Ambulatory Visit (INDEPENDENT_AMBULATORY_CARE_PROVIDER_SITE_OTHER): Payer: 59 | Admitting: Sports Medicine

## 2016-07-09 ENCOUNTER — Encounter: Payer: Self-pay | Admitting: Sports Medicine

## 2016-07-09 DIAGNOSIS — M7042 Prepatellar bursitis, left knee: Secondary | ICD-10-CM

## 2016-07-09 MED ORDER — MELOXICAM 15 MG PO TABS: 15.0000 mg | ORAL_TABLET | Freq: Every day | ORAL | 11 refills | Status: DC

## 2016-07-09 NOTE — Progress Notes (Signed)
  Subjective:    CC: Follow-up  HPI: Left prepatellar bursitis: He has started wearing kneepads at work, works as a Development worker, community. We did an aspiration and injection at the last visit he's doing well. It's occasional pain that he localizes under the kneecap, he also has some hip pain, tells me he was diagnosed with hip osteoarthritis based on an x-ray sometime in the past. Really doesn't take any medications on a daily basis. Symptoms are mild, intermittent.  Past medical history:  Negative.  See flowsheet/record as well for more information.  Surgical history: Negative.  See flowsheet/record as well for more information.  Family history: Negative.  See flowsheet/record as well for more information.  Social history: Negative.  See flowsheet/record as well for more information.  Allergies, and medications have been entered into the medical record, reviewed, and no changes needed.   Review of Systems: No fevers, chills, night sweats, weight loss, chest pain, or shortness of breath.   Objective:    General: Well Developed, well nourished, and in no acute distress.  Neuro: Alert and oriented x3, extra-ocular muscles intact, sensation grossly intact.  HEENT: Normocephalic, atraumatic, pupils equal round reactive to light, neck supple, no masses, no lymphadenopathy, thyroid nonpalpable.  Skin: Warm and dry, no rashes. Cardiac: Regular rate and rhythm, no murmurs rubs or gallops, no lower extremity edema.  Respiratory: Clear to auscultation bilaterally. Not using accessory muscles, speaking in full sentences. Left Knee: Normal to inspection with no erythema or effusion or obvious bony abnormalities. Palpation normal with no warmth or joint line tenderness or patellar tenderness or condyle tenderness. ROM normal in flexion and extension and lower leg rotation. Ligaments with solid consistent endpoints including ACL, PCL, LCL, MCL. Negative Mcmurray's and provocative meniscal tests. Painful patellar  compression with mild crepitus, prepatellar bursitis has resolved Patellar and quadriceps tendons unremarkable. Hamstring and quadriceps strength is normal.  Impression and Recommendations:    Prepatellar bursitis, left knee Doing well after aspiration and injection of prepatellar bursitis. 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 Adding meloxicam daily, he will work with Dr. Madilyn Fireman on weight loss.

## 2016-07-09 NOTE — Assessment & Plan Note (Signed)
Doing well after aspiration and injection of prepatellar bursitis. 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 Adding meloxicam daily, he will work with Dr. Madilyn Fireman on weight loss.

## 2016-07-20 ENCOUNTER — Telehealth: Payer: Self-pay | Admitting: Gastroenterology

## 2016-07-20 NOTE — Telephone Encounter (Signed)
Patient's wife is calling back to see if an antibiotic could be called in or if pt could do anything else in the meantime for diverticulitis flare up.

## 2016-07-21 NOTE — Telephone Encounter (Signed)
Again tried to reach the pt and no answer.  Message left for pt to return call if he still needs assistance.

## 2016-07-21 NOTE — Telephone Encounter (Signed)
Patient wife calling back regarding this. Best # [phone] or [phone]

## 2016-07-21 NOTE — Telephone Encounter (Signed)
I agree he should go to ER with his severe pain. Please call back and let him know (if they answer).  Also offer cipro 500mg  po bid for 10 days, flagyl 500mg  po tid for 10 days.  thanks

## 2016-07-21 NOTE — Telephone Encounter (Signed)
FYI Dr Ardis Hughs, I was speaking with the pt's wife regarding the level of pain the pt is in and the fact that he can not stand up straight and may need ED eval.  She asked if Dr Ardis Hughs could see him and I advised her that Dr Ardis Hughs was not seeing pt's today and she said well I will tell him to suffer.  I was trying to let her know that I would see if one of the PA or NP could see him and she hung up the phone.  I tried to return call and no one answered the phone.

## 2016-07-21 NOTE — Telephone Encounter (Signed)
Left message on machine to call back  

## 2016-07-21 NOTE — Telephone Encounter (Signed)
Diverticulitis flare, can not stand up straight or eat.  Has pain on the LL side 10/10 pain.  The pain started 2 days ago.  No fever.

## 2016-07-22 ENCOUNTER — Ambulatory Visit (INDEPENDENT_AMBULATORY_CARE_PROVIDER_SITE_OTHER): Payer: 59 | Admitting: Family Medicine

## 2016-07-22 ENCOUNTER — Encounter: Payer: Self-pay | Admitting: Family Medicine

## 2016-07-22 VITALS — BP 153/91 | HR 99 | Temp 98.0°F | Wt 288.0 lb

## 2016-07-22 DIAGNOSIS — K5732 Diverticulitis of large intestine without perforation or abscess without bleeding: Secondary | ICD-10-CM

## 2016-07-22 MED ORDER — CIPROFLOXACIN HCL 500 MG PO TABS: 500.0000 mg | ORAL_TABLET | Freq: Two times a day (BID) | ORAL | 0 refills | Status: DC

## 2016-07-22 MED ORDER — METRONIDAZOLE 500 MG PO TABS: 500.0000 mg | ORAL_TABLET | Freq: Three times a day (TID) | ORAL | 0 refills | Status: DC

## 2016-07-22 NOTE — Patient Instructions (Signed)
Thank you for coming in today. If your belly pain worsens, or you have high fever, bad vomiting, blood in your stool or black tarry stool go to the Emergency Room.    Diverticulitis Diverticulitis is inflammation or infection of small pouches in your colon that form when you have a condition called diverticulosis. The pouches in your colon are called diverticula. Your colon, or large intestine, is where water is absorbed and stool is formed. Complications of diverticulitis can include:  Bleeding.  Severe infection.  Severe pain.  Perforation of your colon.  Obstruction of your colon. What are the causes? Diverticulitis is caused by bacteria. Diverticulitis happens when stool becomes trapped in diverticula. This allows bacteria to grow in the diverticula, which can lead to inflammation and infection. What increases the risk? People with diverticulosis are at risk for diverticulitis. Eating a diet that does not include enough fiber from fruits and vegetables may make diverticulitis more likely to develop. What are the signs or symptoms? Symptoms of diverticulitis may include:  Abdominal pain and tenderness. The pain is normally located on the left side of the abdomen, but may occur in other areas.  Fever and chills.  Bloating.  Cramping.  Nausea.  Vomiting.  Constipation.  Diarrhea.  Blood in your stool. How is this diagnosed? Your health care provider will ask you about your medical history and do a physical exam. You may need to have tests done because many medical conditions can cause the same symptoms as diverticulitis. Tests may include:  Blood tests.  Urine tests.  Imaging tests of the abdomen, including X-rays and CT scans. When your condition is under control, your health care provider may recommend that you have a colonoscopy. A colonoscopy can show how severe your diverticula are and whether something else is causing your symptoms. How is this treated? Most  cases of diverticulitis are mild and can be treated at home. Treatment may include:  Taking over-the-counter pain medicines.  Following a clear liquid diet.  Taking antibiotic medicines by mouth for 7-10 days. More severe cases may be treated at a hospital. Treatment may include:  Not eating or drinking.  Taking prescription pain medicine.  Receiving antibiotic medicines through an IV tube.  Receiving fluids and nutrition through an IV tube.  Surgery. Follow these instructions at home:  Follow your health care provider's instructions carefully.  Follow a full liquid diet or other diet as directed by your health care provider. After your symptoms improve, your health care provider may tell you to change your diet. He or she may recommend you eat a high-fiber diet. Fruits and vegetables are good sources of fiber. Fiber makes it easier to pass stool.  Take fiber supplements or probiotics as directed by your health care provider.  Only take medicines as directed by your health care provider.  Keep all your follow-up appointments. Contact a health care provider if:  Your pain does not improve.  You have a hard time eating food.  Your bowel movements do not return to normal. Get help right away if:  Your pain becomes worse.  Your symptoms do not get better.  Your symptoms suddenly get worse.  You have a fever.  You have repeated vomiting.  You have bloody or black, tarry stools. This information is not intended to replace advice given to you by your health care provider. Make sure you discuss any questions you have with your health care provider. Document Released: 03/24/2005 Document Revised: 11/20/2015 Document Reviewed: 05/09/2013 Elsevier  Interactive Patient Education  2017 [employer] Liquid Diet, Adult A clear liquid diet is a diet that includes only liquids that you can see through. You may need to follow a clear liquid diet if:  You develop a  medical condition right before or after you have surgery.  You were not able to eat food for a long period of time.  You had a condition that gave you diarrhea.  You are going to have an exam, such as a colonoscopy, in which instruments will be put into your body to look at parts of your digestive system.  You are going to have bowel surgery. The usual goals of this diet are:  To rest the stomach and digestive system as much as possible.  To keep you hydrated.  To make sure you get some calories for energy.  To help you return to normal digestion. Most people need to follow this diet for only a short period of time. What do I need to know about this diet?  A clear liquid is a liquid that you can see through when you hold it up to a light.  A clear liquid diet does not provide all the nutrients that you need. It is important to choose a variety of the liquids that are allowed on this diet. That way, you will get as many nutrients as possible.  If you are not sure whether you can have certain items, ask your health care provider. What can I have?  Water and flavored water.  Fruit juices that do not have pulp, such as cranberry juice and apple juice.  Tea and coffee without milk or cream.  Clear bouillon or broth.  Broth-based soups that have been strained.  Flavored gelatins.  Honey.  Sugar water.  Frozen ice or frozen ice pops that do not contain milk, yogurt, fruit pieces, or fruit pulp.  Clear sodas.  Clear sports drinks. The items listed above may not be a complete list of recommended liquids. Contact your dietitian for more options.  What can I not have?  Juices that have pulp.  Milk.  Cream or cream-based soups.  Yogurt. The items listed above may not be a complete list of liquids to avoid. Contact your dietitian for more information.  Summary  A clear liquid diet is a diet that includes only liquids that you can see through.  The goal of this diet  is to help you recover by resting your digestive system, keeping you hydrated, and providing nutrients.  Make sure to avoid liquids with milk, cream, or pulp while on this diet. This information is not intended to replace advice given to you by your health care provider. Make sure you discuss any questions you have with your health care provider. Document Released: 06/14/2005 Document Revised: 01/27/2016 Document Reviewed: 05/11/2013 Elsevier Interactive Patient Education  2017 [employer].

## 2016-07-22 NOTE — Progress Notes (Signed)
Thomas Pineda is a 46 y.o. male who presents to [facility]: [place] today for abdominal pain.  He has had intermittent left lower quadrant abdominal pain for the past 24 hours associated with nausea. The pain feels sharp and pressure-like. He has eaten a sandwich that worsened the pain but otherwise has only been drinking water and tea. He has had 4 nonbloody bowel movements. No fevers, vomiting or diarrhea. He has diverticulitis flare ups every few months for the past couple of years with similar symptoms; last flare up was a few months ago. Cipro and Flagyl have worked in the past. He has never seen a Psychologist, sport and exercise for this.   Past Medical History:  Diagnosis Date  . Abscess of buttock   . Abscessed tooth   . Alcoholism ([place])   . Anxiety   . Depression   . Diverticulitis   . GERD (gastroesophageal reflux disease)   . Gout   . HLD (hyperlipidemia)   . Hypertension   . Palpitations   . Peptic ulcer   . Skin abnormalities    Past Surgical History:  Procedure Laterality Date  . HAND SURGERY Right 1999  . SQUAMOUS CELL CARCINOMA EXCISION Right 07/21/2011   forearm   Social History  Substance Use Topics  . Smoking status: Former Smoker    Quit date: 07/29/2013  . Smokeless tobacco: Never Used     Comment: uses e-cig  . Alcohol use Yes     Comment: occasional-quit but still have a couple   family history includes Alcohol abuse in his father; Brain cancer in his paternal aunt; Breast cancer in his maternal grandmother; Colon polyps in his maternal aunt; Diabetes in his father, maternal grandfather, and mother; Heart disease in his father and maternal grandfather; Hypertension in his father; Irritable bowel syndrome in his cousin; Liver disease in his maternal grandmother; Lung cancer in his paternal grandfather; Renal cancer in his father; Throat cancer in his paternal grandfather;  Ulcerative colitis in his maternal grandmother.  ROS as above:  Medications: Current Outpatient Prescriptions  Medication Sig Dispense Refill  . ciprofloxacin (CIPRO) 500 MG tablet Take 1 tablet (500 mg total) by mouth 2 (two) times daily. 14 tablet 0  . meloxicam (MOBIC) 15 MG tablet Take 1 tablet (15 mg total) by mouth daily. 30 tablet 11  . metroNIDAZOLE (FLAGYL) 500 MG tablet Take 1 tablet (500 mg total) by mouth 3 (three) times daily. 21 tablet 0   Current Facility-Administered Medications  Medication Dose Route Frequency Provider Last Rate Last Dose  . 0.9 %  sodium chloride infusion  500 mL Intravenous Continuous Milus Banister, MD       Allergies  Allergen Reactions  . Penicillins     Health Maintenance Health Maintenance  Topic Date Due  . HIV Screening  01/22/1986  . COLONOSCOPY  05/18/2021  . TETANUS/TDAP  01/10/2023  . INFLUENZA VACCINE  Completed     Exam:  BP (!) 153/91   Pulse 99   Temp 98 F (36.7 C) (Oral)   Wt 288 lb (130.6 kg)   SpO2 98%   BMI 39.06 kg/m  Gen: Uncomfortable but non-toxic appearing HEENT: EOMI,  MMM Lungs: Normal work of breathing. CTABL Heart: RRR no MRG Abd: NABS, Soft, obese, tender to palpation in LLQ and suprapubic areas, no diffuse rebound tenderness. No CVA tenderness. Exts: Brisk capillary refill, warm and well perfused.    No results found for this or  any previous visit (from the past 72 hour(s)). No results found.    Assessment and Plan: 46 y.o. male with a history of diverticulitis presenting with 24h of intermittent LLQ pain, most likely due to another diverticulitis flare. Clinically and hemodynamically stable without evidence of peritonitis. - Cipro and Flagyl x 7 days - Clear liquid diet - Return precautions discussed - Refer to general surgery given multiple flares over past few years   No orders of the defined types were placed in this encounter.   Discussed warning signs or symptoms. Please see  discharge instructions. Patient expresses understanding.

## 2016-07-27 ENCOUNTER — Ambulatory Visit: Payer: Self-pay | Admitting: General Surgery

## 2016-07-27 MED ORDER — DEXTROSE 5 % IV SOLN: 5.0000 mg/kg | INTRAVENOUS | Status: DC

## 2016-07-27 MED ORDER — DEXTROSE 5 % IV SOLN: 900.0000 mg | INTRAVENOUS | Status: DC

## 2016-08-04 ENCOUNTER — Encounter (HOSPITAL_COMMUNITY): Payer: Self-pay

## 2016-08-04 NOTE — Patient Instructions (Addendum)
Thomas Pineda  08/04/2016   Your procedure is scheduled on: 07/09/16  Report to [facility] Main  Entrance take [place]  elevators to 3rd floor to  [place] at   0730 AM.  Call this number if you have problems the morning of surgery [phone]   Remember: ONLY 1 PERSON MAY GO WITH YOU TO SHORT STAY TO GET  READY MORNING OF [place].  Do not eat food or drink liquids :After Midnight.     Take these medicines the morning of surgery with A SIP OF WATER:  NONE                               You may not have any metal on your body including hair pins and              piercings  Do not wear jewelry,lotions, powders or perfumes, deodorant                         Men may shave face and neck.   Do not bring valuables to the hospital. [place].  Contacts, dentures or bridgework may not be worn into surgery.  Leave suitcase in the car. After surgery it may be brought to your room.                  Please read over the following fact sheets you were given: _____________________________________________________________________             [facility] - Preparing for Surgery Before surgery, you can play an important role.  Because skin is not sterile, your skin needs to be as free of germs as possible.  You can reduce the number of germs on your skin by washing with CHG (chlorahexidine gluconate) soap before surgery.  CHG is an antiseptic cleaner which kills germs and bonds with the skin to continue killing germs even after washing. Please DO NOT use if you have an allergy to CHG or antibacterial soaps.  If your skin becomes reddened/irritated stop using the CHG and inform your nurse when you arrive at Short Stay. Do not shave (including legs and underarms) for at least 48 hours prior to the first CHG shower.  You may shave your face/neck. Please follow these instructions carefully:  1.  Shower with CHG Soap the  night before surgery and the  morning of Surgery.  2.  If you choose to wash your hair, wash your hair first as usual with your  normal  shampoo.  3.  After you shampoo, rinse your hair and body thoroughly to remove the  shampoo.                           4.  Use CHG as you would any other liquid soap.  You can apply chg directly  to the skin and wash                       Gently with a scrungie or clean washcloth.  5.  Apply the CHG Soap to your body ONLY FROM THE NECK DOWN.   Do not use on face/ open  Wound or open sores. Avoid contact with eyes, ears mouth and genitals (private parts).                       Wash face,  Genitals (private parts) with your normal soap.             6.  Wash thoroughly, paying special attention to the area where your surgery  will be performed.  7.  Thoroughly rinse your body with warm water from the neck down.  8.  DO NOT shower/wash with your normal soap after using and rinsing off  the CHG Soap.                9.  Pat yourself dry with a clean towel.            10.  Wear clean pajamas.            11.  Place clean sheets on your bed the night of your first shower and do not  sleep with pets. Day of Surgery : Do not apply any lotions/deodorants the morning of surgery.  Please wear clean clothes to the hospital/surgery center.  FAILURE TO FOLLOW THESE INSTRUCTIONS MAY RESULT IN THE CANCELLATION OF YOUR SURGERY PATIENT SIGNATURE_________________________________  NURSE SIGNATURE__________________________________  ________________________________________________________________________  WHAT IS A BLOOD TRANSFUSION? Blood Transfusion Information  A transfusion is the replacement of blood or some of its parts. Blood is made up of multiple cells which provide different functions.  Red blood cells carry oxygen and are used for blood loss replacement.  White blood cells fight against infection.  Platelets control bleeding.  Plasma  helps clot blood.  Other blood products are available for specialized needs, such as hemophilia or other clotting disorders. BEFORE THE TRANSFUSION  Who gives blood for transfusions?   Healthy volunteers who are fully evaluated to make sure their blood is safe. This is blood bank blood. Transfusion therapy is the safest it has ever been in the practice of medicine. Before blood is taken from a donor, a complete history is taken to make sure that person has no history of diseases nor engages in risky social behavior (examples are intravenous drug use or sexual activity with multiple partners). The donor's travel history is screened to minimize risk of transmitting infections, such as malaria. The donated blood is tested for signs of infectious diseases, such as HIV and hepatitis. The blood is then tested to be sure it is compatible with you in order to minimize the chance of a transfusion reaction. If you or a relative donates blood, this is often done in anticipation of surgery and is not appropriate for emergency situations. It takes many days to process the donated blood. RISKS AND COMPLICATIONS Although transfusion therapy is very safe and saves many lives, the main dangers of transfusion include:   Getting an infectious disease.  Developing a transfusion reaction. This is an allergic reaction to something in the blood you were given. Every precaution is taken to prevent this. The decision to have a blood transfusion has been considered carefully by your caregiver before blood is given. Blood is not given unless the benefits outweigh the risks. AFTER THE TRANSFUSION  Right after receiving a blood transfusion, you will usually feel much better and more energetic. This is especially true if your red blood cells have gotten low (anemic). The transfusion raises the level of the red blood cells which carry oxygen, and this usually causes an energy increase.  The  nurse administering the transfusion  will monitor you carefully for complications. HOME CARE INSTRUCTIONS  No special instructions are needed after a transfusion. You may find your energy is better. Speak with your caregiver about any limitations on activity for underlying diseases you may have. SEEK MEDICAL CARE IF:   Your condition is not improving after your transfusion.  You develop redness or irritation at the intravenous (IV) site. SEEK IMMEDIATE MEDICAL CARE IF:  Any of the following symptoms occur over the next 12 hours:  Shaking chills.  You have a temperature by mouth above 102 F (38.9 C), not controlled by medicine.  Chest, back, or muscle pain.  People around you feel you are not acting correctly or are confused.  Shortness of breath or difficulty breathing.  Dizziness and fainting.  You get a rash or develop hives.  You have a decrease in urine output.  Your urine turns a dark color or changes to pink, red, or brown. Any of the following symptoms occur over the next 10 days:  You have a temperature by mouth above 102 F (38.9 C), not controlled by medicine.  Shortness of breath. Weakness after normal activity.   CLEAR LIQUID DIET   Foods Allowed                                                                     Foods Excluded  Coffee and tea, regular and decaf                             liquids that you cannot  Plain Jell-O in any flavor                                             see through such as: Fruit ices (not with fruit pulp)                                     milk, soups, orange juice  Iced Popsicles                                    All solid food Carbonated beverages, regular and diet                                    Cranberry, grape and apple juices Sports drinks like Gatorade Lightly seasoned clear broth or consume(fat free) Sugar, honey syrup  Sample Menu Breakfast                                Lunch                                     Supper Cranberry juice  Beef broth                            Chicken broth Jell-O                                     Grape juice                           Apple juice Coffee or tea                        Jell-O                                      Popsicle                                                Coffee or tea                        Coffee or tea  _____________________________________________________________________     The white part of the eye turns yellow (jaundice).  You have a decrease in the amount of urine or are urinating less often.  Your urine turns a dark color or changes to pink, red, or brown. Document Released: 06/11/2000 Document Revised: 09/06/2011 Document Reviewed: 01/29/2008 [facility] Patient Information 2014 [place], [place].  _______________________________________________________________________

## 2016-08-05 ENCOUNTER — Encounter (HOSPITAL_COMMUNITY)
Admission: RE | Admit: 2016-08-05 | Discharge: 2016-08-05 | Disposition: A | Discharge status: Home / Self Care | Payer: 59 | Source: Ambulatory Visit | Attending: General Surgery | Admitting: General Surgery

## 2016-08-05 ENCOUNTER — Ambulatory Visit (HOSPITAL_COMMUNITY)
Admission: RE | Admit: 2016-08-05 | Discharge: 2016-08-05 | Disposition: A | Discharge status: Home / Self Care | Payer: 59 | Source: Ambulatory Visit | Attending: General Surgery | Admitting: General Surgery

## 2016-08-05 ENCOUNTER — Encounter (HOSPITAL_COMMUNITY): Payer: Self-pay

## 2016-08-05 DIAGNOSIS — Z01812 Encounter for preprocedural laboratory examination: Secondary | ICD-10-CM | POA: Insufficient documentation

## 2016-08-05 DIAGNOSIS — Z01818 Encounter for other preprocedural examination: Secondary | ICD-10-CM | POA: Insufficient documentation

## 2016-08-05 DIAGNOSIS — K5792 Diverticulitis of intestine, part unspecified, without perforation or abscess without bleeding: Secondary | ICD-10-CM | POA: Diagnosis not present

## 2016-08-05 DIAGNOSIS — I498 Other specified cardiac arrhythmias: Secondary | ICD-10-CM | POA: Diagnosis not present

## 2016-08-05 DIAGNOSIS — Z01810 Encounter for preprocedural cardiovascular examination: Secondary | ICD-10-CM

## 2016-08-05 HISTORY — DX: Unspecified osteoarthritis, unspecified site: M19.90

## 2016-08-05 HISTORY — DX: Cardiac arrhythmia, unspecified: I49.9

## 2016-08-05 HISTORY — DX: Malignant (primary) neoplasm, unspecified: C80.1

## 2016-08-05 LAB — COMPREHENSIVE METABOLIC PANEL
ALBUMIN: 4.2 g/dL (ref 3.5–5.0)
ALK PHOS: 64 U/L (ref 38–126)
ALT: 34 U/L (ref 17–63)
ANION GAP: 8 (ref 5–15)
AST: 23 U/L (ref 15–41)
BILIRUBIN TOTAL: 0.8 mg/dL (ref 0.3–1.2)
BUN: 12 mg/dL (ref 6–20)
CALCIUM: 8.8 mg/dL — AB (ref 8.9–10.3)
CO2: 27 mmol/L (ref 22–32)
Chloride: 105 mmol/L (ref 101–111)
Creatinine, Ser: 0.9 mg/dL (ref 0.61–1.24)
GLUCOSE: 77 mg/dL (ref 65–99)
Potassium: 3.9 mmol/L (ref 3.5–5.1)
Sodium: 140 mmol/L (ref 135–145)
TOTAL PROTEIN: 7.2 g/dL (ref 6.5–8.1)

## 2016-08-05 LAB — CBC WITH DIFFERENTIAL/PLATELET
Basophils Absolute: 0 10*3/uL (ref 0.0–0.1)
Basophils Relative: 0 %
EOS PCT: 3 %
Eosinophils Absolute: 0.3 10*3/uL (ref 0.0–0.7)
HEMATOCRIT: 43.8 % (ref 39.0–52.0)
HEMOGLOBIN: 14.8 g/dL (ref 13.0–17.0)
Lymphocytes Relative: 31 %
Lymphs Abs: 2.7 10*3/uL (ref 0.7–4.0)
MCH: 29.2 pg (ref 26.0–34.0)
MCHC: 33.8 g/dL (ref 30.0–36.0)
MCV: 86.4 fL (ref 78.0–100.0)
Monocytes Absolute: 0.4 10*3/uL (ref 0.1–1.0)
Monocytes Relative: 5 %
Neutro Abs: 5.4 10*3/uL (ref 1.7–7.7)
Neutrophils Relative %: 61 %
Platelets: 288 10*3/uL (ref 150–400)
RBC: 5.07 MIL/uL (ref 4.22–5.81)
RDW: 13.7 % (ref 11.5–15.5)
WBC: 8.8 10*3/uL (ref 4.0–10.5)

## 2016-08-05 LAB — ABO/RH: ABO/RH(D): A POS

## 2016-08-06 LAB — HEMOGLOBIN A1C
Hgb A1c MFr Bld: 5.8 % — ABNORMAL HIGH (ref 4.8–5.6)
MEAN PLASMA GLUCOSE: 120 mg/dL

## 2016-08-09 ENCOUNTER — Inpatient Hospital Stay (HOSPITAL_COMMUNITY)
Admission: RE | Admit: 2016-08-09 | Discharge: 2016-08-12 | DRG: 331 | Disposition: A | Discharge status: Home / Self Care | Payer: 59 | Source: Ambulatory Visit | Attending: General Surgery | Admitting: General Surgery

## 2016-08-09 ENCOUNTER — Encounter (HOSPITAL_COMMUNITY): Payer: Self-pay | Admitting: *Deleted

## 2016-08-09 ENCOUNTER — Inpatient Hospital Stay (HOSPITAL_COMMUNITY): Payer: 59 | Admitting: Anesthesiology

## 2016-08-09 ENCOUNTER — Encounter (HOSPITAL_COMMUNITY)
Admission: RE | Disposition: A | Discharge status: Home / Self Care | Payer: Self-pay | Source: Ambulatory Visit | Attending: General Surgery

## 2016-08-09 DIAGNOSIS — F1290 Cannabis use, unspecified, uncomplicated: Secondary | ICD-10-CM | POA: Diagnosis present

## 2016-08-09 DIAGNOSIS — K5732 Diverticulitis of large intestine without perforation or abscess without bleeding: Secondary | ICD-10-CM | POA: Diagnosis present

## 2016-08-09 DIAGNOSIS — K660 Peritoneal adhesions (postprocedural) (postinfection): Secondary | ICD-10-CM | POA: Diagnosis present

## 2016-08-09 DIAGNOSIS — K668 Other specified disorders of peritoneum: Secondary | ICD-10-CM | POA: Diagnosis present

## 2016-08-09 DIAGNOSIS — Z8249 Family history of ischemic heart disease and other diseases of the circulatory system: Secondary | ICD-10-CM

## 2016-08-09 DIAGNOSIS — Z880 Allergy status to penicillin: Secondary | ICD-10-CM | POA: Diagnosis not present

## 2016-08-09 DIAGNOSIS — Z791 Long term (current) use of non-steroidal anti-inflammatories (NSAID): Secondary | ICD-10-CM | POA: Diagnosis not present

## 2016-08-09 DIAGNOSIS — Z87891 Personal history of nicotine dependence: Secondary | ICD-10-CM

## 2016-08-09 DIAGNOSIS — Z6837 Body mass index (BMI) 37.0-37.9, adult: Secondary | ICD-10-CM | POA: Diagnosis not present

## 2016-08-09 HISTORY — PX: LAPAROSCOPIC PARTIAL COLECTOMY: SHX5907

## 2016-08-09 LAB — CBC
HCT: 42.8 % (ref 39.0–52.0)
HEMOGLOBIN: 14.5 g/dL (ref 13.0–17.0)
MCH: 28.8 pg (ref 26.0–34.0)
MCHC: 33.9 g/dL (ref 30.0–36.0)
MCV: 85.1 fL (ref 78.0–100.0)
PLATELETS: 245 10*3/uL (ref 150–400)
RBC: 5.03 MIL/uL (ref 4.22–5.81)
RDW: 13.4 % (ref 11.5–15.5)
WBC: 19.2 10*3/uL — ABNORMAL HIGH (ref 4.0–10.5)

## 2016-08-09 LAB — CREATININE, SERUM
Creatinine, Ser: 1.15 mg/dL (ref 0.61–1.24)
GFR calc Af Amer: >60 mL/min (ref >60)
GFR calc non Af Amer: >60 mL/min (ref >60)

## 2016-08-09 LAB — TYPE AND SCREEN
ABO/RH(D): A POS
Antibody Screen: NEGATIVE

## 2016-08-09 SURGERY — LAPAROSCOPIC PARTIAL COLECTOMY
Anesthesia: General

## 2016-08-09 MED ORDER — ONDANSETRON HCL 4 MG/2ML IJ SOLN
INTRAMUSCULAR | Status: DC | PRN
  Administered 2016-08-09: 4 mg via INTRAVENOUS

## 2016-08-09 MED ORDER — KETOROLAC TROMETHAMINE 30 MG/ML IJ SOLN
30.0000 mg | Freq: Four times a day (QID) | INTRAMUSCULAR | Status: DC | PRN
  Administered 2016-08-09 – 2016-08-12 (×4): 30 mg via INTRAVENOUS
  Filled 2016-08-09 (×4): qty 1

## 2016-08-09 MED ORDER — SUCCINYLCHOLINE CHLORIDE 200 MG/10ML IV SOSY
PREFILLED_SYRINGE | INTRAVENOUS | Status: AC
  Filled 2016-08-09: qty 10

## 2016-08-09 MED ORDER — 0.9 % SODIUM CHLORIDE (POUR BTL) OPTIME
TOPICAL | Status: DC | PRN
  Administered 2016-08-09: 2000 mL

## 2016-08-09 MED ORDER — SUFENTANIL CITRATE 50 MCG/ML IV SOLN
INTRAVENOUS | Status: AC
  Filled 2016-08-09: qty 1

## 2016-08-09 MED ORDER — LIDOCAINE 2% (20 MG/ML) 5 ML SYRINGE
INTRAMUSCULAR | Status: AC
Start: 2016-08-09 — End: 2016-08-09
  Filled 2016-08-09: qty 5

## 2016-08-09 MED ORDER — SODIUM CHLORIDE 0.9 % IV SOLN
INTRAVENOUS | Status: DC
  Administered 2016-08-09 – 2016-08-10 (×2): via INTRAVENOUS
  Administered 2016-08-10: 1000 mL via INTRAVENOUS
  Administered 2016-08-11: 75 mL/h via INTRAVENOUS

## 2016-08-09 MED ORDER — GENTAMICIN SULFATE 40 MG/ML IJ SOLN
500.0000 mg | Freq: Once | INTRAVENOUS | Status: AC
  Administered 2016-08-09: 500 mg via INTRAVENOUS
  Filled 2016-08-09: qty 12.5

## 2016-08-09 MED ORDER — LACTATED RINGERS IV SOLN: INTRAVENOUS | Status: DC

## 2016-08-09 MED ORDER — ACETAMINOPHEN 325 MG PO TABS: 650.0000 mg | ORAL_TABLET | Freq: Four times a day (QID) | ORAL | Status: DC | PRN

## 2016-08-09 MED ORDER — ACETAMINOPHEN 10 MG/ML IV SOLN
INTRAVENOUS | Status: DC | PRN
  Administered 2016-08-09: 1000 mg via INTRAVENOUS

## 2016-08-09 MED ORDER — MIDAZOLAM HCL 2 MG/2ML IJ SOLN
INTRAMUSCULAR | Status: AC
  Filled 2016-08-09: qty 2

## 2016-08-09 MED ORDER — BUPIVACAINE HCL (PF) 0.25 % IJ SOLN
INTRAMUSCULAR | Status: DC | PRN
  Administered 2016-08-09: 30 mL

## 2016-08-09 MED ORDER — SODIUM CHLORIDE 0.9 % IJ SOLN
INTRAMUSCULAR | Status: AC
  Filled 2016-08-09: qty 10

## 2016-08-09 MED ORDER — SUGAMMADEX SODIUM 500 MG/5ML IV SOLN
INTRAVENOUS | Status: AC
  Filled 2016-08-09: qty 5

## 2016-08-09 MED ORDER — HYDROCODONE-ACETAMINOPHEN 5-325 MG PO TABS
1.0000 | ORAL_TABLET | ORAL | Status: DC | PRN
  Administered 2016-08-10 (×2): 2 via ORAL
  Administered 2016-08-10: 1 via ORAL
  Administered 2016-08-10 – 2016-08-12 (×9): 2 via ORAL
  Filled 2016-08-09 (×12): qty 2

## 2016-08-09 MED ORDER — LACTATED RINGERS IR SOLN
Status: DC | PRN
  Administered 2016-08-09: 1000 mL

## 2016-08-09 MED ORDER — ROCURONIUM BROMIDE 10 MG/ML (PF) SYRINGE
PREFILLED_SYRINGE | INTRAVENOUS | Status: DC | PRN
  Administered 2016-08-09: 10 mg via INTRAVENOUS
  Administered 2016-08-09: 50 mg via INTRAVENOUS
  Administered 2016-08-09: 20 mg via INTRAVENOUS
  Administered 2016-08-09: 10 mg via INTRAVENOUS
  Administered 2016-08-09 (×2): 20 mg via INTRAVENOUS

## 2016-08-09 MED ORDER — HYDROMORPHONE HCL 1 MG/ML IJ SOLN
INTRAMUSCULAR | Status: AC
  Administered 2016-08-09: 0.5 mg via INTRAVENOUS
  Filled 2016-08-09: qty 1

## 2016-08-09 MED ORDER — BUPIVACAINE LIPOSOME 1.3 % IJ SUSP
20.0000 mL | Freq: Once | INTRAMUSCULAR | Status: AC
  Administered 2016-08-09: 20 mL
  Filled 2016-08-09: qty 20

## 2016-08-09 MED ORDER — SODIUM CHLORIDE 0.9 % IV SOLN
INTRAVENOUS | Status: DC
  Filled 2016-08-09: qty 6

## 2016-08-09 MED ORDER — ALVIMOPAN 12 MG PO CAPS
12.0000 mg | ORAL_CAPSULE | Freq: Once | ORAL | Status: AC
  Administered 2016-08-09: 12 mg via ORAL
  Filled 2016-08-09: qty 1

## 2016-08-09 MED ORDER — SUFENTANIL CITRATE 50 MCG/ML IV SOLN
INTRAVENOUS | Status: DC | PRN
  Administered 2016-08-09 (×4): 10 ug via INTRAVENOUS
  Administered 2016-08-09: 20 ug via INTRAVENOUS
  Administered 2016-08-09 (×4): 10 ug via INTRAVENOUS

## 2016-08-09 MED ORDER — MIDAZOLAM HCL 2 MG/2ML IJ SOLN
INTRAMUSCULAR | Status: DC | PRN
  Administered 2016-08-09: 2 mg via INTRAVENOUS

## 2016-08-09 MED ORDER — ENOXAPARIN SODIUM 40 MG/0.4ML ~~LOC~~ SOLN
40.0000 mg | SUBCUTANEOUS | Status: DC
  Administered 2016-08-10 – 2016-08-12 (×3): 40 mg via SUBCUTANEOUS
  Filled 2016-08-09 (×3): qty 0.4

## 2016-08-09 MED ORDER — LACTATED RINGERS IV SOLN
INTRAVENOUS | Status: DC | PRN
  Administered 2016-08-09 (×3): via INTRAVENOUS

## 2016-08-09 MED ORDER — SUGAMMADEX SODIUM 200 MG/2ML IV SOLN
INTRAVENOUS | Status: DC | PRN
  Administered 2016-08-09: 300 mg via INTRAVENOUS

## 2016-08-09 MED ORDER — PROPOFOL 10 MG/ML IV BOLUS
INTRAVENOUS | Status: DC | PRN
  Administered 2016-08-09: 200 mg via INTRAVENOUS

## 2016-08-09 MED ORDER — HYDROMORPHONE HCL 1 MG/ML IJ SOLN
0.2500 mg | INTRAMUSCULAR | Status: DC | PRN
  Administered 2016-08-09 (×2): 0.5 mg via INTRAVENOUS

## 2016-08-09 MED ORDER — SUCCINYLCHOLINE CHLORIDE 200 MG/10ML IV SOSY
PREFILLED_SYRINGE | INTRAVENOUS | Status: DC | PRN
  Administered 2016-08-09: 160 mg via INTRAVENOUS

## 2016-08-09 MED ORDER — ALVIMOPAN 12 MG PO CAPS
12.0000 mg | ORAL_CAPSULE | Freq: Two times a day (BID) | ORAL | Status: DC
  Administered 2016-08-10 (×2): 12 mg via ORAL
  Filled 2016-08-09 (×2): qty 1

## 2016-08-09 MED ORDER — DEXAMETHASONE SODIUM PHOSPHATE 10 MG/ML IJ SOLN
INTRAMUSCULAR | Status: AC
Start: 2016-08-09 — End: 2016-08-09
  Filled 2016-08-09: qty 1

## 2016-08-09 MED ORDER — KETAMINE HCL 10 MG/ML IJ SOLN
INTRAMUSCULAR | Status: DC | PRN
  Administered 2016-08-09 (×3): 10 mg via INTRAVENOUS
  Administered 2016-08-09: 30 mg via INTRAVENOUS
  Administered 2016-08-09 (×4): 10 mg via INTRAVENOUS

## 2016-08-09 MED ORDER — HEPARIN SODIUM (PORCINE) 5000 UNIT/ML IJ SOLN
5000.0000 [IU] | Freq: Once | INTRAMUSCULAR | Status: AC
  Administered 2016-08-09: 5000 [IU] via SUBCUTANEOUS
  Filled 2016-08-09: qty 1

## 2016-08-09 MED ORDER — DEXAMETHASONE SODIUM PHOSPHATE 10 MG/ML IJ SOLN
INTRAMUSCULAR | Status: DC | PRN
  Administered 2016-08-09: 10 mg via INTRAVENOUS

## 2016-08-09 MED ORDER — CLINDAMYCIN PHOSPHATE 900 MG/50ML IV SOLN
INTRAVENOUS | Status: AC
  Filled 2016-08-09: qty 50

## 2016-08-09 MED ORDER — MORPHINE SULFATE (PF) 4 MG/ML IV SOLN
2.0000 mg | INTRAVENOUS | Status: DC | PRN
  Administered 2016-08-09: 4 mg via INTRAVENOUS
  Administered 2016-08-09: 2 mg via INTRAVENOUS
  Administered 2016-08-09 – 2016-08-10 (×4): 4 mg via INTRAVENOUS
  Filled 2016-08-09 (×6): qty 1

## 2016-08-09 MED ORDER — BUPIVACAINE HCL (PF) 0.25 % IJ SOLN
INTRAMUSCULAR | Status: AC
  Filled 2016-08-09: qty 30

## 2016-08-09 MED ORDER — ROCURONIUM BROMIDE 50 MG/5ML IV SOSY
PREFILLED_SYRINGE | INTRAVENOUS | Status: AC
  Filled 2016-08-09: qty 5

## 2016-08-09 MED ORDER — CLINDAMYCIN PHOSPHATE 900 MG/50ML IV SOLN
900.0000 mg | Freq: Once | INTRAVENOUS | Status: AC
  Administered 2016-08-09: 900 mg via INTRAVENOUS

## 2016-08-09 MED ORDER — ONDANSETRON HCL 4 MG/2ML IJ SOLN: 4.0000 mg | Freq: Four times a day (QID) | INTRAMUSCULAR | Status: DC | PRN

## 2016-08-09 MED ORDER — ONDANSETRON HCL 4 MG PO TABS: 4.0000 mg | ORAL_TABLET | Freq: Four times a day (QID) | ORAL | Status: DC | PRN

## 2016-08-09 MED ORDER — KETAMINE HCL 10 MG/ML IJ SOLN
INTRAMUSCULAR | Status: AC
  Filled 2016-08-09: qty 1

## 2016-08-09 MED ORDER — ONDANSETRON HCL 4 MG/2ML IJ SOLN
INTRAMUSCULAR | Status: AC
Start: 2016-08-09 — End: 2016-08-09
  Filled 2016-08-09: qty 2

## 2016-08-09 MED ORDER — ACETAMINOPHEN 10 MG/ML IV SOLN
INTRAVENOUS | Status: AC
  Filled 2016-08-09: qty 100

## 2016-08-09 MED ORDER — PROPOFOL 10 MG/ML IV BOLUS
INTRAVENOUS | Status: AC
  Filled 2016-08-09: qty 20

## 2016-08-09 MED ORDER — LIDOCAINE 2% (20 MG/ML) 5 ML SYRINGE
INTRAMUSCULAR | Status: DC | PRN
  Administered 2016-08-09: 100 mg via INTRAVENOUS

## 2016-08-09 SURGICAL SUPPLY — 71 items
APPLIER CLIP 5 13 M/L LIGAMAX5 (MISCELLANEOUS)
APPLIER CLIP ROT 10 11.4 M/L (STAPLE)
BANDAGE ADH SHEER 1  50/CT (GAUZE/BANDAGES/DRESSINGS) IMPLANT
BENZOIN TINCTURE PRP APPL 2/3 (GAUZE/BANDAGES/DRESSINGS) ×3 IMPLANT
BLADE EXTENDED COATED 6.5IN (ELECTRODE) IMPLANT
CABLE HIGH FREQUENCY MONO STRZ (ELECTRODE) ×3 IMPLANT
CELLS DAT CNTRL 66122 CELL SVR (MISCELLANEOUS) IMPLANT
CHLORAPREP W/TINT 26ML (MISCELLANEOUS) IMPLANT
CLIP APPLIE 5 13 M/L LIGAMAX5 (MISCELLANEOUS) IMPLANT
CLIP APPLIE ROT 10 11.4 M/L (STAPLE) IMPLANT
CLOSURE WOUND 1/2 X4 (GAUZE/BANDAGES/DRESSINGS) ×1
COUNTER NEEDLE 20 DBL MAG RED (NEEDLE) ×3 IMPLANT
COVER MAYO STAND STRL (DRAPES) ×9 IMPLANT
COVER SURGICAL LIGHT HANDLE (MISCELLANEOUS) IMPLANT
DECANTER SPIKE VIAL GLASS SM (MISCELLANEOUS) ×3 IMPLANT
DRAIN CHANNEL 19F RND (DRAIN) IMPLANT
DRAPE LAPAROSCOPIC ABDOMINAL (DRAPES) ×3 IMPLANT
DRSG OPSITE POSTOP 4X10 (GAUZE/BANDAGES/DRESSINGS) IMPLANT
DRSG OPSITE POSTOP 4X6 (GAUZE/BANDAGES/DRESSINGS) IMPLANT
DRSG OPSITE POSTOP 4X8 (GAUZE/BANDAGES/DRESSINGS) IMPLANT
ELECT PENCIL ROCKER SW 15FT (MISCELLANEOUS) ×6 IMPLANT
ELECT REM PT RETURN 15FT ADLT (MISCELLANEOUS) ×3 IMPLANT
ENDOLOOP SUT PDS II  0 18 (SUTURE)
ENDOLOOP SUT PDS II 0 18 (SUTURE) IMPLANT
GAUZE SPONGE 4X4 12PLY STRL (GAUZE/BANDAGES/DRESSINGS) IMPLANT
GLOVE BIO SURGEON STRL SZ7 (GLOVE) ×6 IMPLANT
GLOVE BIOGEL PI IND STRL 7.0 (GLOVE) ×2 IMPLANT
GLOVE BIOGEL PI INDICATOR 7.0 (GLOVE) ×4
GOWN STRL REUS W/TWL LRG LVL3 (GOWN DISPOSABLE) ×6 IMPLANT
GOWN STRL REUS W/TWL XL LVL3 (GOWN DISPOSABLE) ×12 IMPLANT
GRASPER SUT TROCAR 14GX15 (MISCELLANEOUS) ×3 IMPLANT
IRRIG SUCT STRYKERFLOW 2 WTIP (MISCELLANEOUS) ×3
IRRIGATION SUCT STRKRFLW 2 WTP (MISCELLANEOUS) ×1 IMPLANT
LEGGING LITHOTOMY PAIR STRL (DRAPES) IMPLANT
PACK COLON (CUSTOM PROCEDURE TRAY) ×3 IMPLANT
PAD POSITIONING PINK XL (MISCELLANEOUS) IMPLANT
PORT LAP GEL ALEXIS MED 5-9CM (MISCELLANEOUS) ×3 IMPLANT
RELOAD STAPLER GREEN 60MM (STAPLE) ×2 IMPLANT
RELOAD STAPLER WHITE 60MM (STAPLE) IMPLANT
RTRCTR WOUND ALEXIS 18CM MED (MISCELLANEOUS)
SCISSORS LAP 5X35 DISP (ENDOMECHANICALS) ×3 IMPLANT
SEALER TISSUE X1 CVD JAW (INSTRUMENTS) IMPLANT
SET IRRIG TUBING LAPAROSCOPIC (IRRIGATION / IRRIGATOR) ×3 IMPLANT
SHEARS HARMONIC ACE PLUS 36CM (ENDOMECHANICALS) ×3 IMPLANT
SLEEVE XCEL OPT CAN 5 100 (ENDOMECHANICALS) ×9 IMPLANT
STAPLER CIRC CVD 29MM 37CM (STAPLE) ×3 IMPLANT
STAPLER ECHELON LONG 60 440 (INSTRUMENTS) ×3 IMPLANT
STAPLER RELOAD GREEN 60MM (STAPLE) ×6
STAPLER RELOAD WHITE 60MM (STAPLE)
STAPLER VISISTAT 35W (STAPLE) ×3 IMPLANT
STRIP CLOSURE SKIN 1/2X4 (GAUZE/BANDAGES/DRESSINGS) ×2 IMPLANT
SUT MNCRL AB 4-0 PS2 18 (SUTURE) ×3 IMPLANT
SUT PDS AB 0 CT1 36 (SUTURE) ×6 IMPLANT
SUT PROLENE 2 0 KS (SUTURE) ×3 IMPLANT
SUT SILK 2 0 (SUTURE) ×2
SUT SILK 2 0 SH CR/8 (SUTURE) ×3 IMPLANT
SUT SILK 2-0 18XBRD TIE 12 (SUTURE) ×1 IMPLANT
SUT SILK 3 0 (SUTURE) ×2
SUT SILK 3 0 SH CR/8 (SUTURE) ×3 IMPLANT
SUT SILK 3-0 18XBRD TIE 12 (SUTURE) ×1 IMPLANT
SUT VIC AB 2-0 SH 27 (SUTURE)
SUT VIC AB 2-0 SH 27X BRD (SUTURE) IMPLANT
SUT VICRYL 0 TIES 12 18 (SUTURE) ×3 IMPLANT
SYS LAPSCP GELPORT 120MM (MISCELLANEOUS)
SYSTEM LAPSCP GELPORT 120MM (MISCELLANEOUS) IMPLANT
TOWEL OR 17X26 10 PK STRL BLUE (TOWEL DISPOSABLE) IMPLANT
TOWEL OR NON WOVEN STRL DISP B (DISPOSABLE) ×3 IMPLANT
TRAY FOLEY W/METER SILVER 16FR (SET/KITS/TRAYS/PACK) IMPLANT
TROCAR BLADELESS OPT 5 100 (ENDOMECHANICALS) ×3 IMPLANT
TROCAR XCEL 12X100 BLDLESS (ENDOMECHANICALS) IMPLANT
TUBING INSUF HEATED (TUBING) ×3 IMPLANT

## 2016-08-09 NOTE — H&P (Signed)
History of Present Illness Thomas Spruce MD; 07/27/2016 4:26 PM) The patient is a 46 year old male who presents with diverticulitis. Referred by Lynne Leader. Patient has a six-year history of intermittent left lower abdominal pain. He was diagnosed with diverticulitis and has multiple rounds of antibiotics. He is noticed that his frequency of symptoms is increasing and now he is having symptoms about every month. He underwent colonoscopy in November 2017 showing some diverticula in the and small polyp. Feels like the pain is getting more severe each time he has an attack. He also notes polyuria at the time of attacks.  He stopped smoking over a year ago. He does not drink. He does smoke occasional marijuana.   Past Surgical History Patsey Berthold, [place]; 07/27/2016 3:31 PM) Colon Polyp Removal - Colonoscopy   Diagnostic Studies History Patsey Berthold, [place]; 07/27/2016 3:31 PM) Colonoscopy  within last year  Allergies Patsey Berthold, CMA; 07/27/2016 3:32 PM) Penicillins   Medication History Patsey Berthold, CMA; 07/27/2016 3:32 PM) Ciprofloxacin HCl (500MG  Tablet, Oral) Active. MetroNIDAZOLE (500MG  Tablet, Oral) Active. Meloxicam (15MG  Tablet, Oral) Active. Medications Reconciled  Social History Patsey Berthold, [place]; 07/27/2016 3:31 PM) Alcohol use  Recently quit alcohol use. Caffeine use  Tea. Tobacco use  Former smoker.  Family History Patsey Berthold, [place]; 07/27/2016 3:31 PM) Alcohol Abuse  Father, Mother. Cancer  Father. Heart disease in male family member before age 41  Hypertension  Father.  Other Problems Patsey Berthold, [place]; 07/27/2016 3:31 PM) Back Pain  Depression  Gastric Ulcer     Review of Systems Patsey Berthold CMA; 07/27/2016 3:31 PM) General Present- Appetite Loss. Not Present- Chills, Fatigue, Fever, Night Sweats, Weight Gain and Weight Loss. Skin Present- Dryness. Not Present- Change in Wart/Mole, Hives, Jaundice, New Lesions, Non-Healing  Wounds, Rash and Ulcer. HEENT Not Present- Earache, Hearing Loss, Hoarseness, Nose Bleed, Oral Ulcers, Ringing in the Ears, Seasonal Allergies, Sinus Pain, Sore Throat, Visual Disturbances, Wears glasses/contact lenses and Yellow Eyes. Respiratory Not Present- Bloody sputum, Chronic Cough, Difficulty Breathing, Snoring and Wheezing. Breast Not Present- Breast Mass, Breast Pain, Nipple Discharge and Skin Changes. Cardiovascular Present- Palpitations. Not Present- Chest Pain, Difficulty Breathing Lying Down, Leg Cramps, Rapid Heart Rate, Shortness of Breath and Swelling of Extremities. Gastrointestinal Present- Change in Bowel Habits and Nausea. Not Present- Abdominal Pain, Bloating, Bloody Stool, Chronic diarrhea, Constipation, Difficulty Swallowing, Excessive gas, Gets full quickly at meals, Hemorrhoids, Indigestion, Rectal Pain and Vomiting. Male Genitourinary Present- Frequency. Not Present- Blood in Urine, Change in Urinary Stream, Impotence, Nocturia, Painful Urination, Urgency and Urine Leakage.  Vitals Patsey Berthold CMA; 07/27/2016 3:33 PM) 07/27/2016 3:32 PM Weight: 286.2 lb Height: 73in Height was reported by patient. Body Surface Area: 2.51 m Body Mass Index: 37.76 kg/m  Temp.: 97.88F  Pulse: 92 (Regular)  BP: 122/88 (Sitting, Left Arm, Standard)       Physical Exam Thomas Spruce MD; 07/27/2016 4:27 PM) General Mental Status-Alert. General Appearance-Cooperative. Orientation-Oriented X4. Posture-Normal posture.  Integumentary Global Assessment Upon inspection and palpation of skin surfaces of the - Head/Face: no rashes, ulcers, lesions or evidence of photo damage. No palpable nodules or masses and Neck: no visible lesions or palpable masses.  Head and Neck Head-normocephalic, atraumatic with no lesions or palpable masses. Face Global Assessment - atraumatic. Thyroid Gland Characteristics - normal size and consistency.  Eye Eyeball -  Bilateral-Extraocular movements intact. Sclera/Conjunctiva - Bilateral-No scleral icterus, No Discharge.  ENMT Nose and Sinuses External Inspection of the Nose - no deformities observed, no  swelling present.  Chest and Lung Exam Palpation Palpation of the chest reveals - Non-tender. Auscultation Breath sounds - Normal.  Cardiovascular Auscultation Rhythm - Regular. Heart Sounds - S1 WNL and S2 WNL. Carotid arteries - No Carotid bruit.  Abdomen Inspection Inspection of the abdomen reveals - No Visible peristalsis, No Abnormal pulsations and No Paradoxical movements. Palpation/Percussion Palpation and Percussion of the abdomen reveal - Soft, No Rebound tenderness, No Rigidity (guarding), No hepatosplenomegaly and No Palpable abdominal masses. Tenderness - Left Lower Quadrant.  Peripheral Vascular Upper Extremity Palpation - Pulses bilaterally normal. Lower Extremity Palpation - Edema - Bilateral - No edema.  Neurologic Neurologic evaluation reveals -normal sensation and normal coordination.  Neuropsychiatric Mental status exam performed with findings of-able to articulate well with normal speech/language, rate, volume and coherence and thought content normal with ability to perform basic computations and apply abstract reasoning.  Musculoskeletal Normal Exam - Bilateral-Upper Extremity Strength Normal and Lower Extremity Strength Normal.    Assessment & Plan Thomas Spruce MD; 07/27/2016 4:29 PM) DIVERTICULITIS LARGE INTESTINE (K57.32) Impression: 46 year old male with stage to obesity and recurrent diverticulitis. I think his symptoms are severe enough to warrant partial colectomy with anastomosis. We discussed the details of this procedure, that it would be done under general anesthesia with planned laparoscopy and a 20% likelihood with need to make an open incision. We discussed that we would identify the sigmoid colon and remove the chronically infected  portion of the colon with plan for a coloproctostomy by transanal EEA stapler. We discussed possible risks of infection, bleeding, ureter injury, leak which may require temporary ostomy. We discussed an inpatient hospitalization afterwards for recuperation and a four-week planned light duty phase. He should good understanding one to proceed. He is finishing his antibiotics from previous course 2 day, I think the amount of time and will take to get [place] appropriately scheduled this the right amount of time to allow for him to get over his current bout to allow optimal success of the procedure. Current Plans Pt Education - [facility] Colon Bowel Prep 2015 Miralax/Antibiotics Started Neomycin Sulfate 500MG , 2 (two) Tablet SEE NOTE, #6, 07/27/2016, No Refill. Local Order: TAKE TWO TABLETS AT 2 PM, 3 PM, AND 10 PM THE DAY PRIOR TO SURGERY Started Flagyl 500MG , 2 (two) Tablet SEE NOTE, #6, 07/27/2016, No Refill. Local Order: Take at 2pm, 3pm, and 10pm the day prior to your colon operation Pt Education - [facility] Colectomy post-op instructions: discussed with patient and provided information. Pt Education - Pamphlet Given - Laparoscopic Colorectal Surgery: discussed with patient and provided information. The anatomy & physiology of the digestive tract was discussed. The pathophysiology of the colon was discussed. Natural history risks without surgery was discussed. I feel the risks of no intervention will lead to serious problems that outweigh the operative risks; therefore, I recommended a partial colectomy to remove the pathology. Minimally invasive (Robotic/Laparoscopic) & open techniques were discussed.  Risks such as bleeding, infection, abscess, leak, reoperation, possible ostomy, hernia, heart attack, death, and other risks were discussed. I noted a good likelihood this will help address the problem. Goals of post-operative recovery were discussed as well. Need for adequate nutrition, daily bowel regimen and  healthy physical activity, to optimize recovery was noted as well. We will work to minimize complications. Educational materials were available as well. Questions were answered. The patient expresses understanding & wishes to proceed with surgery.

## 2016-08-09 NOTE — Transfer of Care (Signed)
Immediate Anesthesia Transfer of Care Note  Patient: Thomas Pineda  Procedure(s) Performed: Procedure(s): LAPAROSCOPIC PARTIAL COLECTOMY WITH ANASTOMOSIS (N/A)  Patient Location: PACU  Anesthesia Type:General  Level of Consciousness: awake  Airway & Oxygen Therapy: Patient Spontanous Breathing and Patient connected to face mask oxygen  Post-op Assessment: Report given to RN and Post -op Vital signs reviewed and stable  Post vital signs: Reviewed and stable  Last Vitals:  Vitals:   08/09/16 0728  BP: (!) 142/89  Pulse: 95  Resp: 18  Temp: 36.6 C    Last Pain:  Vitals:   08/09/16 0741  TempSrc:   PainSc: 0-No pain      Patients Stated Pain Goal: 4 ([id number])  Complications: No apparent anesthesia complications

## 2016-08-09 NOTE — Interval H&P Note (Signed)
History and Physical Interval Note:  08/09/2016 9:10 AM  Thomas Pineda  has presented today for surgery, with the diagnosis of RECURRENT DIVERTICULITIS  The various methods of treatment have been discussed with the patient and family. After consideration of risks, benefits and other options for treatment, the patient has consented to  Procedure(s): LAPAROSCOPIC PARTIAL COLECTOMY WITH ANASTOMOSIS (N/A) as a surgical intervention .  The patient's history has been reviewed, patient examined, no change in status, stable for surgery.  I have reviewed the patient's chart and labs.  Questions were answered to the patient's satisfaction.    He no longer has symptoms and took his bowel prep  Arta Bruce Alnita Aybar

## 2016-08-09 NOTE — Anesthesia Procedure Notes (Signed)
Procedure Name: Intubation Date/Time: 08/09/2016 9:26 AM Performed by: Danley Danker L Patient Re-evaluated:Patient Re-evaluated prior to inductionOxygen Delivery Method: Circle system utilized Preoxygenation: Pre-oxygenation with 100% oxygen Intubation Type: IV induction Ventilation: Mask ventilation without difficulty and Oral airway inserted - appropriate to patient size Laryngoscope Size: Miller and 3 Grade View: Grade I Tube type: Oral Tube size: 8.0 mm Number of attempts: 1 Airway Equipment and Method: Stylet Placement Confirmation: ETT inserted through vocal cords under direct vision,  positive ETCO2 and breath sounds checked- equal and bilateral Secured at: 22 cm Tube secured with: Tape Dental Injury: Teeth and Oropharynx as per pre-operative assessment

## 2016-08-09 NOTE — Progress Notes (Signed)
Chaplain stopped in to visit with patient while rounding the floor.  Patient was sleeping because he has recently arrived from surgery.  The mother is at the bedside.  Chaplain introduced herself to the mother and the mother stated that their prayers have been answered.  Chaplain "Praise the [employer]  The mother stated that she is not from this area of [place] and feels lost.  Mother said she is waiting on the patient's spouse who went to get the suitcase out of the car.  Mother said everything is going well.  No identified spiritual needs at this time.  Pleasant visit.  Dorna Bloom Resident Pager [phone]   08/09/16 1519  Clinical Encounter Type  Visited With Patient;Family  Visit Type Initial

## 2016-08-09 NOTE — Anesthesia Preprocedure Evaluation (Addendum)
Anesthesia Evaluation  Patient identified by MRN, date of birth, ID band Patient awake    Reviewed: Allergy & Precautions, H&P , Patient's Chart, lab work & pertinent test results, reviewed documented beta blocker date and time   Airway Mallampati: II  TM Distance: >3 FB Neck ROM: full    Dental no notable dental hx. (+) Loose,    Pulmonary former smoker,    Pulmonary exam normal breath sounds clear to auscultation       Cardiovascular hypertension,  Rhythm:regular Rate:Normal     Neuro/Psych    GI/Hepatic GERD  ,  Endo/Other  Morbid obesity  Renal/GU      Musculoskeletal   Abdominal   Peds  Hematology   Anesthesia Other Findings Alcoholism       Hypertension  No medications   GERD   Discussed dental concerns    Reproductive/Obstetrics                            Anesthesia Physical Anesthesia Plan  ASA: II  Anesthesia Plan: General   Post-op Pain Management:    Induction: Intravenous  Airway Management Planned: Oral ETT  Additional Equipment:   Intra-op Plan:   Post-operative Plan: Extubation in OR  Informed Consent: I have reviewed the patients History and Physical, chart, labs and discussed the procedure including the risks, benefits and alternatives for the proposed anesthesia with the patient or authorized representative who has indicated his/her understanding and acceptance.   Dental Advisory Given and Dental advisory given  Plan Discussed with: CRNA and Surgeon  Anesthesia Plan Comments: (  Discussed general anesthesia, including possible nausea, instrumentation of airway, sore throat,pulmonary aspiration, etc. I asked if the were any outstanding questions, or  concerns before we proceeded.)        Anesthesia Quick Evaluation

## 2016-08-09 NOTE — Anesthesia Postprocedure Evaluation (Signed)
Anesthesia Post Note  Patient: Thomas Pineda  Procedure(s) Performed: Procedure(s) (LRB): LAPAROSCOPIC PARTIAL COLECTOMY WITH ANASTOMOSIS (N/A)  Patient location during evaluation: PACU Anesthesia Type: General Level of consciousness: sedated Pain management: satisfactory to patient Vital Signs Assessment: post-procedure vital signs reviewed and stable Respiratory status: spontaneous breathing Cardiovascular status: stable Anesthetic complications: no       Last Vitals:  Vitals:   08/09/16 1455 08/09/16 1552  BP: (!) 148/88 (!) 147/93  Pulse: 68 64  Resp: 14 15  Temp: 36.7 C 36.6 C    Last Pain:  Vitals:   08/09/16 1552  TempSrc: Oral  PainSc:                  Riccardo Dubin

## 2016-08-09 NOTE — Op Note (Signed)
Preoperative diagnosis: recurrent diverticulitis  Postoperative diagnosis: same   Procedure: laparoscopic partial colectomy with colorectostomy Surgeon: Gurney Maxin, M.D.  Asst: Romana Juniper  Anesthesia: general  Indications for procedure: Thomas Pineda is a 46 y.o. year old male with symptoms of recurrent left lower quadrant abdominal pain. He was diagnosed with diverticulitis, underwent multiple rounds of antibiotics but continued to have recurrence of symptoms. After discussing with the patient the risks benefits and alternatives he was consented for laparoscopic vs open partial colectomy.  Description of procedure: The patient was brought into the operative suite. Anesthesia was administered with General endotracheal anesthesia. WHO checklist was applied. The patient was then placed in lithotomy position. The area was prepped and draped in the usual sterile fashion.  Small incision was made to the right of the umbilicus and a 5 mm optical entry trocar was used to gain access to the knee him. Pneumoperitoneum was applied with a high flow low pressure and laparoscope was reinserted to confirm placement. Next 3 additional 5 mm trochars are placed one in the infraumbilical space, 1 in the right lower abdomen space, 1 in the left upper quadrant space. Upon surveying the abdomen sigmoid colon had epiploica adhered to the anterior abdominal wall consistent with diagnosis of diverticulitis. This was disconnected with Harmonic scalpel. Next the peritoneal reflection of the sigmoid mesentery was incised blunt dissection was used to dissect through the mesentery into the retroperitoneal space and attempt to identify the ureter. Initially was difficult to identify the ureter therefore, lateral approach was used and all adhesions of the sigmoid colon to the left abdominal wall were freed with Harmonic scalpel. This allowed visualization of the ureter and revisiting from medial aspect showed that it was deep  to the perceived colon vessels.  Next the sigmoid vessels were taken with Harmonic scalpel. After this, the white line of Toldt on the left side of the abdomen was incised up to the spleen using Harmonic scalpel and in the left colon was bluntly mobilized. Next turned our attention to the rectosigmoid junction incising the peritoneal reflection down to this point looking for evidence of splaying of the tinea. Once this was encountered the fat of the mesentery was incised and removed from this point using Harmonic scalpel. This performed in 360. The right lower quadrant incision was upgraded to a 12 mm trocar. Next a 60 mm echelon green load stapler was used to divide at the rectal sigmoid junction.  Once this was complete the remainder of the small mesenteric vessels of the distal sigmoid transected with Harmonic scalpel to completely free up the distal end of the specimen. This concern for length of the left colon, therefore further dissection at the splenic flexure was performed and further blunt mobilization of the left colon was also performed.  At this time the infraumbilical 5 mm incision was expanded up to a 30 mm incision and cautery was used to dissect down to its obtains tissues and enter the fascia. A wound protector put in place, and the colon was brought out through this incision. He was still questionable this time therefore instead of performing pursestring maneuver we decided to use a second 60 mm echelon green load stapler to divide at a healthy portion of colon and remove the specimen. The distal portion of the left colon was placed back into the abdomen. Peritoneum was re-insufflated. An beginning at the transverse colon the omentum was divided moving towards the left upper quadrant. Upon reexamining the mesentery left colon and retroperitoneum  and there were multiple small adhesive bands that were partially holding the colon back, these were taken down with Harmonic scalpel. Upon doing  this there is a gain of length and we decided to proceed with anastomosis.  Distal colon was brought back out through the small infraumbilical incision and a 29 mm EEA anvil was secured in place and a pursestring device and a 0 Prolene. Next Dr. Windle Guard went below to perform the transanal portion of the anastomosis and brought the spike out just above the rectal staple line. The anastomosis was completed. The anastomosis was tested with air leak which was negative. The abdomen was irrigated with saline and this was removed. Next was irrigated with antibiotic solution and this was left in. The right lower quadrant 12 mm incision was closed with a single 0 Vicryl was transfascial suture passer.  We then performed colorectal protocol with changing of gowns. Next the infraumbilical fascia was closed with 0 PDS in running fashion. All skin incisions were closed with 0 Monocryl in subcuticular fashion. Steri-Strips and Band-Aids or dressings were put in place over all incisions. All counts are correct. Patient tired procedure well.  Findings: intact anastomosis  Specimen: sigmoid colon  Implant: none   Blood loss: 134ml  Local anesthesia: 12ml exparel:marcaine mix  Complications: none  Gurney Maxin, M.D. General, Bariatric, & Minimally Invasive Surgery [facility]

## 2016-08-10 ENCOUNTER — Encounter (HOSPITAL_COMMUNITY): Payer: Self-pay | Admitting: General Surgery

## 2016-08-10 LAB — CBC
HEMATOCRIT: 39.6 % (ref 39.0–52.0)
Hemoglobin: 13.4 g/dL (ref 13.0–17.0)
MCH: 29 pg (ref 26.0–34.0)
MCHC: 33.8 g/dL (ref 30.0–36.0)
MCV: 85.7 fL (ref 78.0–100.0)
PLATELETS: 248 10*3/uL (ref 150–400)
RBC: 4.62 MIL/uL (ref 4.22–5.81)
RDW: 13.7 % (ref 11.5–15.5)
WBC: 13.7 10*3/uL — ABNORMAL HIGH (ref 4.0–10.5)

## 2016-08-10 LAB — BASIC METABOLIC PANEL
Anion gap: 7 (ref 5–15)
BUN: 11 mg/dL (ref 6–20)
CHLORIDE: 107 mmol/L (ref 101–111)
CO2: 24 mmol/L (ref 22–32)
CREATININE: 0.84 mg/dL (ref 0.61–1.24)
Calcium: 8.5 mg/dL — ABNORMAL LOW (ref 8.9–10.3)
GFR calc Af Amer: >60 mL/min (ref >60)
GFR calc non Af Amer: >60 mL/min (ref >60)
GLUCOSE: 129 mg/dL — AB (ref 65–99)
POTASSIUM: 4.4 mmol/L (ref 3.5–5.1)
Sodium: 138 mmol/L (ref 135–145)

## 2016-08-10 NOTE — Progress Notes (Signed)
  Progress Note: General Surgery Service   Subjective: Pain all over his abdomen and shoulders. Able to walk yesterday  Objective: Vital signs in last 24 hours: Temp:  [97.5 F (36.4 C)-98.6 F (37 C)] 97.8 F (36.6 C) (02/13 0615) Pulse Rate:  [64-101] 71 (02/13 0615) Resp:  [10-16] 16 (02/13 0615) BP: (116-164)/(71-100) 116/72 (02/13 0615) SpO2:  [99 %-100 %] 100 % (02/13 0615) Last BM Date: 08/08/16  Intake/Output from previous day: 02/12 0701 - 02/13 0700 In: 3949.5 [P.O.:600; I.V.:3237; IV Piggyback:112.5] Out: 3600 [Urine:3550; Blood:50] Intake/Output this shift: No intake/output data recorded.  Lungs: CTAB  Cardiovascular: RRR  Abd: soft, ATTP, ND, incisions c/d/i  Extremities: no edema  Neuro: AOx4  Lab Results: CBC   Recent Labs  08/09/16 1536 08/10/16 0440  WBC 19.2* 13.7*  HGB 14.5 13.4  HCT 42.8 39.6  PLT 245 248   BMET  Recent Labs  08/09/16 1536 08/10/16 0440  NA  --  138  K  --  4.4  CL  --  107  CO2  --  24  GLUCOSE  --  129*  BUN  --  11  CREATININE 1.15 0.84  CALCIUM  --  8.5*   PT/INR No results for input(s): LABPROT, INR in the last 72 hours. ABG No results for input(s): PHART, HCO3 in the last 72 hours.  Invalid input(s): PCO2, PO2  Studies/Results:  Anti-infectives: Anti-infectives    Start     Dose/Rate Route Frequency Ordered Stop   08/09/16 0845  clindamycin (CLEOCIN) IVPB 900 mg     900 mg 100 mL/hr over 30 Minutes Intravenous  Once 08/09/16 0832 08/09/16 1000   08/09/16 0845  gentamicin (GARAMYCIN) 500 mg in dextrose 5 % 100 mL IVPB     500 mg 112.5 mL/hr over 60 Minutes Intravenous  Once 08/09/16 0832 08/09/16 1000   08/09/16 0730  clindamycin (CLEOCIN) 900 mg, gentamicin (GARAMYCIN) 240 mg in sodium chloride 0.9 % 1,000 mL for intraperitoneal lavage  Status:  Discontinued      Intraperitoneal To Surgery 08/09/16 [id number] 08/09/16 1459      Medications: Scheduled Meds: . alvimopan  12 mg Oral BID  .  enoxaparin (LOVENOX) injection  40 mg Subcutaneous Q24H   Continuous Infusions: . sodium chloride 1,000 mL (08/10/16 0608)   PRN Meds:.acetaminophen, HYDROcodone-acetaminophen, ketorolac, morphine injection, ondansetron **OR** ondansetron (ZOFRAN) IV  Assessment/Plan: Patient Active Problem List   Diagnosis Date Noted  . Diverticulitis large intestine 08/09/2016  . Prepatellar bursitis, left knee 05/11/2016  . Pain of left great toe 05/26/2015  . Other and unspecified hyperlipidemia 03/09/2014  . Palpitations 03/31/2011  . Hypertension 03/31/2011   s/p Procedure(s): LAPAROSCOPIC PARTIAL COLECTOMY WITH ANASTOMOSIS 08/09/2016 -clears -continue to ambulate -pain control -dc foley    LOS: 1 day   Mickeal Skinner, MD Pg# [phone] [facility]

## 2016-08-11 LAB — CBC
HCT: 39.7 % (ref 39.0–52.0)
HEMOGLOBIN: 13 g/dL (ref 13.0–17.0)
MCH: 28.7 pg (ref 26.0–34.0)
MCHC: 32.7 g/dL (ref 30.0–36.0)
MCV: 87.6 fL (ref 78.0–100.0)
PLATELETS: 220 10*3/uL (ref 150–400)
RBC: 4.53 MIL/uL (ref 4.22–5.81)
RDW: 13.9 % (ref 11.5–15.5)
WBC: 11.2 10*3/uL — ABNORMAL HIGH (ref 4.0–10.5)

## 2016-08-11 NOTE — Progress Notes (Signed)
  Progress Note: General Surgery Service   Subjective: Pain slightly improved, small BM overnight, no nausea, tolerating liquids  Objective: Vital signs in last 24 hours: Temp:  [97.9 F (36.6 C)-98.5 F (36.9 C)] 97.9 F (36.6 C) (02/14 0525) Pulse Rate:  [57-67] 63 (02/14 0525) Resp:  [16-18] 18 (02/14 0525) BP: (136-169)/(52-92) 148/81 (02/14 0525) SpO2:  [98 %-100 %] 100 % (02/14 0525) Last BM Date: 08/11/16  Intake/Output from previous day: 02/13 0701 - 02/14 0700 In: 2865 [P.O.:1440; I.V.:1425] Out: 1725 [Urine:1725] Intake/Output this shift: Total I/O In: 420 [P.O.:420] Out: -   Lungs: CTAB  Cardiovascular: RRR  Abd: soft, ATTP peri-incisionally, nondistended  Extremities: no edema  Neuro: AOx4  Lab Results: CBC   Recent Labs  08/10/16 0440 08/11/16 0526  WBC 13.7* 11.2*  HGB 13.4 13.0  HCT 39.6 39.7  PLT 248 220   BMET  Recent Labs  08/09/16 1536 08/10/16 0440  NA  --  138  K  --  4.4  CL  --  107  CO2  --  24  GLUCOSE  --  129*  BUN  --  11  CREATININE 1.15 0.84  CALCIUM  --  8.5*   PT/INR No results for input(s): LABPROT, INR in the last 72 hours. ABG No results for input(s): PHART, HCO3 in the last 72 hours.  Invalid input(s): PCO2, PO2  Studies/Results:  Anti-infectives: Anti-infectives    Start     Dose/Rate Route Frequency Ordered Stop   08/09/16 0845  clindamycin (CLEOCIN) IVPB 900 mg     900 mg 100 mL/hr over 30 Minutes Intravenous  Once 08/09/16 0832 08/09/16 1000   08/09/16 0845  gentamicin (GARAMYCIN) 500 mg in dextrose 5 % 100 mL IVPB     500 mg 112.5 mL/hr over 60 Minutes Intravenous  Once 08/09/16 0832 08/09/16 1000   08/09/16 0730  clindamycin (CLEOCIN) 900 mg, gentamicin (GARAMYCIN) 240 mg in sodium chloride 0.9 % 1,000 mL for intraperitoneal lavage  Status:  Discontinued      Intraperitoneal To Surgery 08/09/16 0723 08/09/16 1459      Medications: Scheduled Meds: . alvimopan  12 mg Oral BID  . enoxaparin  (LOVENOX) injection  40 mg Subcutaneous Q24H   Continuous Infusions: . sodium chloride 75 mL/hr (08/11/16 0620)   PRN Meds:.acetaminophen, HYDROcodone-acetaminophen, ketorolac, morphine injection, ondansetron **OR** ondansetron (ZOFRAN) IV  Assessment/Plan: Patient Active Problem List   Diagnosis Date Noted  . Diverticulitis large intestine 08/09/2016  . Prepatellar bursitis, left knee 05/11/2016  . Pain of left great toe 05/26/2015  . Other and unspecified hyperlipidemia 03/09/2014  . Palpitations 03/31/2011  . Hypertension 03/31/2011   s/p Procedure(s): LAPAROSCOPIC PARTIAL COLECTOMY WITH ANASTOMOSIS 08/09/2016, +return bowel function -advance soft diet -decrease IV fluids -continue to ambulate    LOS: 2 days   Mickeal Skinner, MD Pg# [phone] [facility]

## 2016-08-12 MED ORDER — IBUPROFEN 800 MG PO TABS: 800.0000 mg | ORAL_TABLET | Freq: Three times a day (TID) | ORAL | 0 refills | Status: DC | PRN

## 2016-08-12 MED ORDER — HYDROCODONE-ACETAMINOPHEN 5-325 MG PO TABS: 1.0000 | ORAL_TABLET | Freq: Four times a day (QID) | ORAL | 0 refills | Status: DC | PRN

## 2016-08-12 NOTE — Progress Notes (Signed)
Pt was discharged home today. Instructions were reviewed with patient, prescriptions were given  and questions were answered. Pt was taken to main entrance via wheelchair by NT.  

## 2016-08-12 NOTE — Discharge Summary (Signed)
Physician Discharge Summary  Patient ID: Thomas Pineda MRN: [id number] DOB/AGE: 1971-03-14 46 y.o.  Admit date: 08/09/2016 Discharge date: 08/12/2016  Admission Diagnoses:  Discharge Diagnoses:  Active Problems:   Diverticulitis large intestine   Discharged Condition: good  Hospital Course: 46 yo male presented and underwent lap sigmoidectomy with anastomosis. Post op he was admitted to the surgical floor. He had pain issues the first 2 days but by day 3 was tolerating ambulation with only PO pain meds. He had return of bowel function POD 2. POD 3 he was discharged home  Consults: None  Significant Diagnostic Studies:  CBC    Component Value Date/Time   WBC 11.2 (H) 08/11/2016 0526   RBC 4.53 08/11/2016 0526   HGB 13.0 08/11/2016 0526   HCT 39.7 08/11/2016 0526   PLT 220 08/11/2016 0526   MCV 87.6 08/11/2016 0526   MCH 28.7 08/11/2016 0526   MCHC 32.7 08/11/2016 0526   RDW 13.9 08/11/2016 0526   LYMPHSABS 2.7 08/05/2016 1442   MONOABS 0.4 08/05/2016 1442   EOSABS 0.3 08/05/2016 1442   BASOSABS 0.0 08/05/2016 1442     Treatments: surgery: lap sigmoidectomy  Discharge Exam: Blood pressure (!) 141/75, pulse (!) 59, temperature 97.8 F (36.6 C), temperature source Oral, resp. rate 18, height 6\' 1"  (1.854 m), weight 126.6 kg (279 lb 3 oz), SpO2 97 %. General appearance: alert and cooperative Head: Normocephalic, without obvious abnormality, atraumatic Resp: clear to auscultation bilaterally Cardio: regular rate and rhythm, S1, S2 normal, no murmur, click, rub or gallop GI: soft, NT, ND, incisions with some ecchymosis  Disposition: 01-Home or Self Care  Discharge Instructions    Call MD for:  difficulty breathing, headache or visual disturbances    Complete by:  As directed    Call MD for:  hives    Complete by:  As directed    Call MD for:  persistant nausea and vomiting    Complete by:  As directed    Call MD for:  redness, tenderness, or signs of infection (pain,  swelling, redness, odor or green/yellow discharge around incision site)    Complete by:  As directed    Call MD for:  severe uncontrolled pain    Complete by:  As directed    Call MD for:  temperature >100.4    Complete by:  As directed    Diet - low sodium heart healthy    Complete by:  As directed    Discharge wound care:    Complete by:  As directed    Ok to shower. steristrips will likely peel off in 1-3 weeks. No bandage required   Driving Restrictions    Complete by:  As directed    No driving while on narcotics   Increase activity slowly    Complete by:  As directed    Lifting restrictions    Complete by:  As directed    No lifting greater than 20 pounds for 3 weeks     Allergies as of 08/12/2016      Reactions   Penicillins Rash   Has patient had a PCN reaction causing immediate rash, facial/tongue/throat swelling, SOB or lightheadedness with hypotension:No  Has patient had a PCN reaction causing severe rash involving mucus membranes or skin necrosis:Yes (head to toe) Has patient had a PCN reaction that required hospitalization:No Has patient had a PCN reaction occurring within the last 10 years:No If all of the above answers are "NO", then may proceed with Cephalosporin use.  Medication List    STOP taking these medications   ciprofloxacin 500 MG tablet Commonly known as:  CIPRO   metroNIDAZOLE 500 MG tablet Commonly known as:  FLAGYL     TAKE these medications   HYDROcodone-acetaminophen 5-325 MG tablet Commonly known as:  NORCO/VICODIN Take 1-2 tablets by mouth every 6 (six) hours as needed for moderate pain.   ibuprofen 800 MG tablet Commonly known as:  ADVIL,MOTRIN Take 1 tablet (800 mg total) by mouth every 8 (eight) hours as needed.   meloxicam 15 MG tablet Commonly known as:  MOBIC Take 1 tablet (15 mg total) by mouth daily.      Follow-up Information    Thomas Bruce Deroy Noah, MD Follow up in 3 week(s).   Specialty:  General Surgery Contact  information: [address] [id number] [phone]           Signed: Arta Bruce [name] 08/12/2016, 8:48 AM

## 2016-10-05 ENCOUNTER — Encounter (HOSPITAL_COMMUNITY): Payer: Self-pay | Admitting: *Deleted

## 2016-11-09 ENCOUNTER — Ambulatory Visit (INDEPENDENT_AMBULATORY_CARE_PROVIDER_SITE_OTHER): Payer: 59 | Admitting: Family Medicine

## 2016-11-09 ENCOUNTER — Ambulatory Visit (INDEPENDENT_AMBULATORY_CARE_PROVIDER_SITE_OTHER): Payer: 59

## 2016-11-09 ENCOUNTER — Encounter: Payer: Self-pay | Admitting: Family Medicine

## 2016-11-09 VITALS — BP 139/82 | HR 104 | Ht 73.0 in | Wt 289.0 lb

## 2016-11-09 DIAGNOSIS — M25522 Pain in left elbow: Secondary | ICD-10-CM

## 2016-11-09 DIAGNOSIS — F410 Panic disorder [episodic paroxysmal anxiety] without agoraphobia: Secondary | ICD-10-CM

## 2016-11-09 DIAGNOSIS — F411 Generalized anxiety disorder: Secondary | ICD-10-CM | POA: Diagnosis not present

## 2016-11-09 DIAGNOSIS — F332 Major depressive disorder, recurrent severe without psychotic features: Secondary | ICD-10-CM | POA: Insufficient documentation

## 2016-11-09 MED ORDER — VENLAFAXINE HCL ER 37.5 MG PO CP24: 37.5000 mg | ORAL_CAPSULE | Freq: Every day | ORAL | 0 refills | Status: DC

## 2016-11-09 NOTE — Progress Notes (Addendum)
Subjective:    Patient ID: Thomas Pineda, male    DOB: 1970-10-20, 46 y.o.   MRN: [id number]  HPI 46 year old male comes in today complaining of anxiety feeling. He says ever since he was a child he still very stressed and anxious. He said he grew up in a home where there was a lot of fighting between his parents. He says he felt with panic attacks ever since then as well. He says years ago when he was in high school they put him on Prozac but says it didn't really work. For years he's been self-medicating with marijuana. He currently smokes 3-4 times a day.  He says that it really is about the only thing that calms him down. He also struggles with depression at times. He has thoughts of being better off dead but says he never actually has thoughts of how he would harm himself. He says he would never actually do that. She said sometimes he gets so down on himself that he thinks that he would not miss him if he were not here. He complains of feeling down and depressed nearly every day as well as low energy. Reports feeling nervous and anxious nearly every day and high levels of irritability.    He also complains of pain in his left elbow. 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 He says that over the weekend he noticed that he was not able to actually straighten his left arm. It  is like it locks. He says his pain as far as the tendinitis is concerned is actually a little better but now he can't straighten his elbow. He denies any known injury or trauma.   Review of Systems   BP 139/82   Pulse (!) 104   Ht 6\' 1"  (1.854 m)   Wt 289 lb (131.1 kg)   SpO2 99%   BMI 38.13 kg/m     Allergies  Allergen Reactions  . Penicillins Rash    Has patient had a PCN reaction causing immediate rash, facial/tongue/throat swelling, SOB or lightheadedness with hypotension:No  Has patient had a PCN reaction causing severe rash involving mucus membranes or skin necrosis:Yes (head  to toe) Has patient had a PCN reaction that required hospitalization:No Has patient had a PCN reaction occurring within the last 10 years:No If all of the above answers are "NO", then may proceed with Cephalosporin use.     Past Medical History:  Diagnosis Date  . Abscess of buttock   . Abscessed tooth   . Alcoholism ([place])   . Anxiety   . Arthritis    l hip l knee  . Cancer ([facility])    Squamous cell r forearm removed  . Depression   . Diverticulitis   . Dysrhythmia    hx of extra beats  . GERD (gastroesophageal reflux disease)    hx of   . Gout   . HLD (hyperlipidemia)   . Hypertension    No medications  . Palpitations   . Peptic ulcer   . Skin abnormalities     Past Surgical History:  Procedure Laterality Date  . HAND SURGERY Right 1999  . LAPAROSCOPIC PARTIAL COLECTOMY N/A 08/09/2016   Procedure: LAPAROSCOPIC PARTIAL COLECTOMY WITH ANASTOMOSIS;  Surgeon: Arta Bruce Kinsinger, MD;  Location: [facility];  Service: General;  Laterality: N/A;  . SQUAMOUS CELL CARCINOMA EXCISION Right 07/21/2011   forearm    Social History   Social History  .  Marital status: Married    Spouse name: N/A  . Number of children: 1  . Years of education: N/A   Occupational History  . plumber Pharmacist, community   Social History Main Topics  . Smoking status: Former Smoker    Quit date: 07/29/2013  . Smokeless tobacco: Never Used     Comment: uses e-cig  . Alcohol use No     Comment: occasional-quit but still have a couple  . Drug use: Yes    Types: Marijuana     Comment: Marijuana occasional  . Sexual activity: Yes    Partners: Female   Other Topics Concern  . Not on file   Social History Narrative   Does not get regular exercise.      Family History  Problem Relation Age of Onset  . Hypertension Father   . Renal cancer Father   . Alcohol abuse Father   . Heart disease Father        atrial fibrillation  . Diabetes Father   . Brain cancer Paternal Aunt    . Diabetes Mother   . Breast cancer Maternal Grandmother   . Ulcerative colitis Maternal Grandmother   . Liver disease Maternal Grandmother   . Diabetes Maternal Grandfather   . Heart disease Maternal Grandfather   . Throat cancer Paternal Grandfather   . Lung cancer Paternal Grandfather   . Colon polyps Maternal Aunt   . Irritable bowel syndrome Cousin        paternal    Outpatient Encounter Prescriptions as of 11/09/2016  Medication Sig  . ibuprofen (ADVIL,MOTRIN) 800 MG tablet Take 1 tablet (800 mg total) by mouth every 8 (eight) hours as needed.  . venlafaxine XR (EFFEXOR XR) 37.5 MG 24 hr capsule Take 1 capsule (37.5 mg total) by mouth daily. After one week increase to 2 tabs .  . [DISCONTINUED] HYDROcodone-acetaminophen (NORCO/VICODIN) 5-325 MG tablet Take 1-2 tablets by mouth every 6 (six) hours as needed for moderate pain.  . [DISCONTINUED] meloxicam (MOBIC) 15 MG tablet Take 1 tablet (15 mg total) by mouth daily.  . [DISCONTINUED] clindamycin (CLEOCIN) 900 mg in dextrose 5 % 50 mL IVPB   . [DISCONTINUED] gentamicin (GARAMYCIN) 650 mg in dextrose 5 % 50 mL IVPB    No facility-administered encounter medications on file as of 11/09/2016.          Objective:   Physical Exam  Constitutional: He is oriented to person, place, and time. He appears well-developed and well-nourished.  HENT:  Head: Normocephalic and atraumatic.  Eyes: Conjunctivae and EOM are normal.  Cardiovascular: Normal rate.   Pulmonary/Chest: Effort normal.  Musculoskeletal:  Nontender olecranon bursa on the left elbow. He is able to extend to about 160  Neurological: He is alert and oriented to person, place, and time.  Skin: Skin is dry. No pallor.  Psychiatric: He has a normal mood and affect. His behavior is normal.  Vitals reviewed.         Assessment & Plan:  Generalized anxiety disorder/major depressive disorder-I did also have him complete a mood questionnaire today. He did answer yes to  8 problems and rated it as a moderate problem. He also does have a family history of manic depressive illness. He himself has never been diagnosed with bipolar disorder. Medical head and start him on Effexor. Discussed the medication potential side effects. I'll see him back in 3 weeks. I do think he might benefit from a mood stabilizer as  well. Discussed the importance of therapy. He says he is not interested in that at this point in time but said he might be in the future. Also encouraged him to start exercising regularly. They says he has problems with his left hip and knee they keep him from exercising like he would like to. Ganz 7 score of 20 and PHQ 9 score of 22 today.   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 If we are going to actually treat his anxiety and depression he needs to be able to get off the marijuana so that we can see if the medication is really doing what needs to be doing.  Left elbow pain -will get plain film x-rays today just to rule out bony fragment that could be causing him to be unable to completely extend the arm.

## 2016-11-12 ENCOUNTER — Ambulatory Visit (INDEPENDENT_AMBULATORY_CARE_PROVIDER_SITE_OTHER): Payer: 59

## 2016-11-12 ENCOUNTER — Encounter: Payer: Self-pay | Admitting: Sports Medicine

## 2016-11-12 ENCOUNTER — Ambulatory Visit (INDEPENDENT_AMBULATORY_CARE_PROVIDER_SITE_OTHER): Payer: 59 | Admitting: Sports Medicine

## 2016-11-12 DIAGNOSIS — M19022 Primary osteoarthritis, left elbow: Principal | ICD-10-CM

## 2016-11-12 DIAGNOSIS — M19021 Primary osteoarthritis, right elbow: Secondary | ICD-10-CM

## 2016-11-12 NOTE — Progress Notes (Signed)
Subjective:    I'm seeing this patient as a consultation for:   Dr. Beatrice Lecher  CC:   Bilateral elbow pain  HPI: This is a pleasant 46 year old male with bilateral elbow pain, he had x-rays that showed significant osteoarthritis of the left elbow, with intra-articular loose bodies. Overall NSAIDs and modification of activity has not been for medically effective. Pain is moderate, persistent with radiation to the mid upper arm and mid forearm. He does get some mechanical blockage of full extension in the left elbow.  Past medical history:  Negative.  See flowsheet/record as well for more information.  Surgical history: Negative.  See flowsheet/record as well for more information.  Family history: Negative.  See flowsheet/record as well for more information.  Social history: Negative.  See flowsheet/record as well for more information.  Allergies, and medications have been entered into the medical record, reviewed, and no changes needed.   Review of Systems: No headache, visual changes, nausea, vomiting, diarrhea, constipation, dizziness, abdominal pain, skin rash, fevers, chills, night sweats, weight loss, swollen lymph nodes, body aches, joint swelling, muscle aches, chest pain, shortness of breath, mood changes, visual or auditory hallucinations.   Objective:   General: Well Developed, well nourished, and in no acute distress.  Neuro/Psych: Alert and oriented x3, extra-ocular muscles intact, able to move all 4 extremities, sensation grossly intact. Skin: Warm and dry, no rashes noted.  Respiratory: Not using accessory muscles, speaking in full sentences, trachea midline.  Cardiovascular: Pulses palpable, no extremity edema. Abdomen: Does not appear distended. Elbows: Unremarkable to inspection. Range of motion does have some limits on the left side with about 5-7 of extension lag, right side has essentially full range of motion, tender to palpation over the posterior lateral  elbow joint Strength is full to all of the above directions Stable to varus, valgus stress. Negative moving valgus stress test. No discrete areas of tenderness to palpation. Ulnar nerve does not sublux. Negative cubital tunnel Tinel's.   Both elbow x-rays were personally reviewed, they show primary osteoarthritis with intra-articular loose bodies.  Procedure: Real-time Ultrasound Guided Injection of right elbow joint Device: GE Logiq E  Verbal informed consent obtained.  Time-out conducted.  Noted no overlying erythema, induration, or other signs of local infection.  Skin prepped in a sterile fashion.  Local anesthesia: Topical Ethyl chloride.  With sterile technique and under real time ultrasound guidance:  Using a 25-gauge needle advanced through the anconeus muscle between the olecranon and the radial head into the joint, and injected 1 mL kenalog 40, 1 mL lidocaine, 1 mL bupivacaine. Completed without difficulty  Pain immediately resolved suggesting accurate placement of the medication.  Advised to call if fevers/chills, erythema, induration, drainage, or persistent bleeding.  Images permanently stored and available for review in the ultrasound unit.  Impression: Technically successful ultrasound guided injection.  Procedure: Real-time Ultrasound Guided Injection of left elbow joint Device: GE Logiq E  Verbal informed consent obtained.  Time-out conducted.  Noted no overlying erythema, induration, or other signs of local infection.  Skin prepped in a sterile fashion.  Local anesthesia: Topical Ethyl chloride.  With sterile technique and under real time ultrasound guidance:  Using a 25-gauge needle advanced through the anconeus muscle between the olecranon and the radial head into the joint, and injected 1 mL kenalog 40, 1 mL lidocaine, 1 mL bupivacaine. Completed without difficulty  Pain immediately resolved suggesting accurate placement of the medication.  Advised to call if  fevers/chills, erythema, induration, drainage,  or persistent bleeding.  Images permanently stored and available for review in the ultrasound unit.  Impression: Technically successful ultrasound guided injection.  Impression and Recommendations:   This case required medical decision making of moderate complexity.  Primary osteoarthritis of both elbows Bilateral osteoarthritis, injection of both elbows. He does have mechanical obstructions with intra-articular bodies. If insufficient relief from bilateral injections we will refer for elbow arthroscopy and removal of joint mice. Return in one month.

## 2016-11-12 NOTE — Assessment & Plan Note (Signed)
Bilateral osteoarthritis, injection of both elbows. He does have mechanical obstructions with intra-articular bodies. If insufficient relief from bilateral injections we will refer for elbow arthroscopy and removal of joint mice. Return in one month.

## 2016-11-30 ENCOUNTER — Ambulatory Visit (INDEPENDENT_AMBULATORY_CARE_PROVIDER_SITE_OTHER): Payer: 59 | Admitting: Family Medicine

## 2016-11-30 ENCOUNTER — Encounter: Payer: Self-pay | Admitting: Family Medicine

## 2016-11-30 VITALS — BP 143/88 | HR 75 | Ht 73.33 in | Wt 293.0 lb

## 2016-11-30 DIAGNOSIS — F1290 Cannabis use, unspecified, uncomplicated: Secondary | ICD-10-CM

## 2016-11-30 DIAGNOSIS — F411 Generalized anxiety disorder: Secondary | ICD-10-CM | POA: Diagnosis not present

## 2016-11-30 MED ORDER — VENLAFAXINE HCL ER 75 MG PO CP24: 75.0000 mg | ORAL_CAPSULE | Freq: Every day | ORAL | 1 refills | Status: DC

## 2016-11-30 NOTE — Progress Notes (Addendum)
   Subjective:    Patient ID: Thomas Pineda, male    DOB: 1971/05/28, 46 y.o.   MRN: [id number]  HPI Follow-up generalized anxiety disorder. He did go ahead and start the Effexor. After one week he tried to go to 2 tabs which was 75 mg and says he felt like it was him is increasing his anxiety and almost making him feel panicky. He went back down to 1 tab for another week and then try going back up to 2. He said at that point he did better on the 75 mg it did not experience the same sensation. He says that he and his wife both have noticed some improvement. He is able to let things go more easily. He's been a little bit more calm and less irritable. So far he hasn't had any negative side effects. Him. 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 He is actually actively looking for a new job.   Review of Systems     Objective:   Physical Exam  Constitutional: He is oriented to person, place, and time. He appears well-developed and well-nourished.  HENT:  Head: Normocephalic and atraumatic.  Cardiovascular: Normal rate, regular rhythm and normal heart sounds.   Pulmonary/Chest: Effort normal and breath sounds normal.  Neurological: He is alert and oriented to person, place, and time.  Skin: Skin is warm and dry.  Psychiatric: He has a normal mood and affect. His behavior is normal.          Assessment & Plan:  Generalized anxiety disorder-  GAD 7 score of 9 which is down from previous of 20. PHQ 9 score of 10.   discussed options. Will stick with the venlafaxine 75 mg daily for the next 4 weeks since he is noticing some positive effects. He is also looking for a new job which also think will significantly reduce his stress levels.   Marijuana use-he says he actually really has cut back will sometimes go 2 or 3 days without using it which for him is a big step. He says he notices that when he does smoke it makes with the Effexor he doesn't feel well so is really been trying hard  at this.   Time spent 20 minutes, greater than 50% time spent counseling about anxiety.

## 2016-12-20 ENCOUNTER — Ambulatory Visit: Payer: 59 | Admitting: Sports Medicine

## 2016-12-20 DIAGNOSIS — Z0189 Encounter for other specified special examinations: Secondary | ICD-10-CM

## 2016-12-28 ENCOUNTER — Ambulatory Visit: Payer: 59 | Admitting: Family Medicine

## 2017-01-07 NOTE — Anesthesia Postprocedure Evaluation (Signed)
Anesthesia Post Note  Patient: Thomas Pineda  Procedure(s) Performed: Procedure(s) (LRB): LAPAROSCOPIC PARTIAL COLECTOMY WITH ANASTOMOSIS (N/A)     Anesthesia Post Evaluation  Last Vitals:  Vitals:   08/11/16 2122 08/12/16 0451  BP: 137/78 (!) 141/75  Pulse: (!) 56 (!) 59  Resp: 18 18  Temp: 36.8 C 36.6 C    Last Pain:  Vitals:   08/12/16 0745  TempSrc:   PainSc: [place]

## 2017-01-07 NOTE — Addendum Note (Signed)
Addendum  created 01/07/17 1507 by Lyndle Herrlich, MD   Sign clinical note

## 2017-01-18 ENCOUNTER — Ambulatory Visit (INDEPENDENT_AMBULATORY_CARE_PROVIDER_SITE_OTHER): Payer: 59 | Admitting: Family Medicine

## 2017-01-18 ENCOUNTER — Encounter: Payer: Self-pay | Admitting: Family Medicine

## 2017-01-18 VITALS — BP 136/81 | HR 77 | Ht 73.0 in | Wt 297.0 lb

## 2017-01-18 DIAGNOSIS — F411 Generalized anxiety disorder: Secondary | ICD-10-CM

## 2017-01-18 MED ORDER — VENLAFAXINE HCL ER 75 MG PO CP24: 75.0000 mg | ORAL_CAPSULE | Freq: Every day | ORAL | 1 refills | Status: DC

## 2017-01-18 NOTE — Progress Notes (Signed)
   Subjective:    Patient ID: Thomas Pineda, male    DOB: [birth year], 46 y.o.   MRN: [id number]  HPI  Here to f/u on Generalized anxiety disorder-overall he's actually been doing fairly well. He's had some major stressors recently. His uncle passed away that he was very close to. And work has been very stressful for him that he started to make some peace with that and so these can try to work on making his current situation at work better. He is not interested in therapy or counseling at this point and he is happy with the Effexor at the current dose. He says every time we've increased the dose he's felt a little odd for couple weeks and does not want to do to with that right now. He says he spends a lot of time thinking about the past and what he could've done differently and says that he's noticed he's not been doing not nearly as much lately.   Review of Systems     Objective:   Physical Exam  Constitutional: He is oriented to person, place, and time. He appears well-developed and well-nourished.  HENT:  Head: Normocephalic and atraumatic.  Eyes: Conjunctivae and EOM are normal.  Cardiovascular: Normal rate.   Pulmonary/Chest: Effort normal.  Neurological: He is alert and oriented to person, place, and time.  Skin: Skin is dry. No pallor.  Psychiatric: He has a normal mood and affect. His behavior is normal.  Vitals reviewed.       Assessment & Plan:  GAD - PHQ 9 score of 9 which is down 1. GAD 7 score of 7 which is down 2 points from previous. We'll continue with current dose of Effexor. He called declines therapy/counseling. He has decreased smoking marijuana. He says in fact he hasn't done it in almost 2 weeks. Follow-up in 4 months.  Time spent 20 minutes, greater than 50% time spent counseling for anxiety.

## 2017-02-22 ENCOUNTER — Ambulatory Visit: Payer: 59 | Admitting: Sports Medicine

## 2017-03-01 ENCOUNTER — Ambulatory Visit: Payer: 59 | Admitting: Sports Medicine

## 2017-05-24 ENCOUNTER — Ambulatory Visit: Payer: 59 | Admitting: Family Medicine

## 2017-05-24 DIAGNOSIS — Z0189 Encounter for other specified special examinations: Secondary | ICD-10-CM

## 2017-05-24 NOTE — Progress Notes (Deleted)
   Subjective:    Patient ID: Thomas Pineda, male    DOB: 05-Aug-1970, 46 y.o.   MRN: [id number]  HPI 46 year old male is here today to follow-up for anxiety disorder.   Review of Systems     Objective:   Physical Exam        Assessment & Plan:  Generalized anxiety disorder-  Marijuana use-

## 2017-08-13 IMAGING — DX DG CHEST 2V
2 series · 2 of 2 positions shown · non-contrast
Comparison: None in PACs

CLINICAL DATA: Preprocedural chest x-ray. Unspecified
cardiovascular exam next week. History of hypertension,
palpitations, former smoker.

EXAM:
CHEST  2 VIEW

[chest pa]
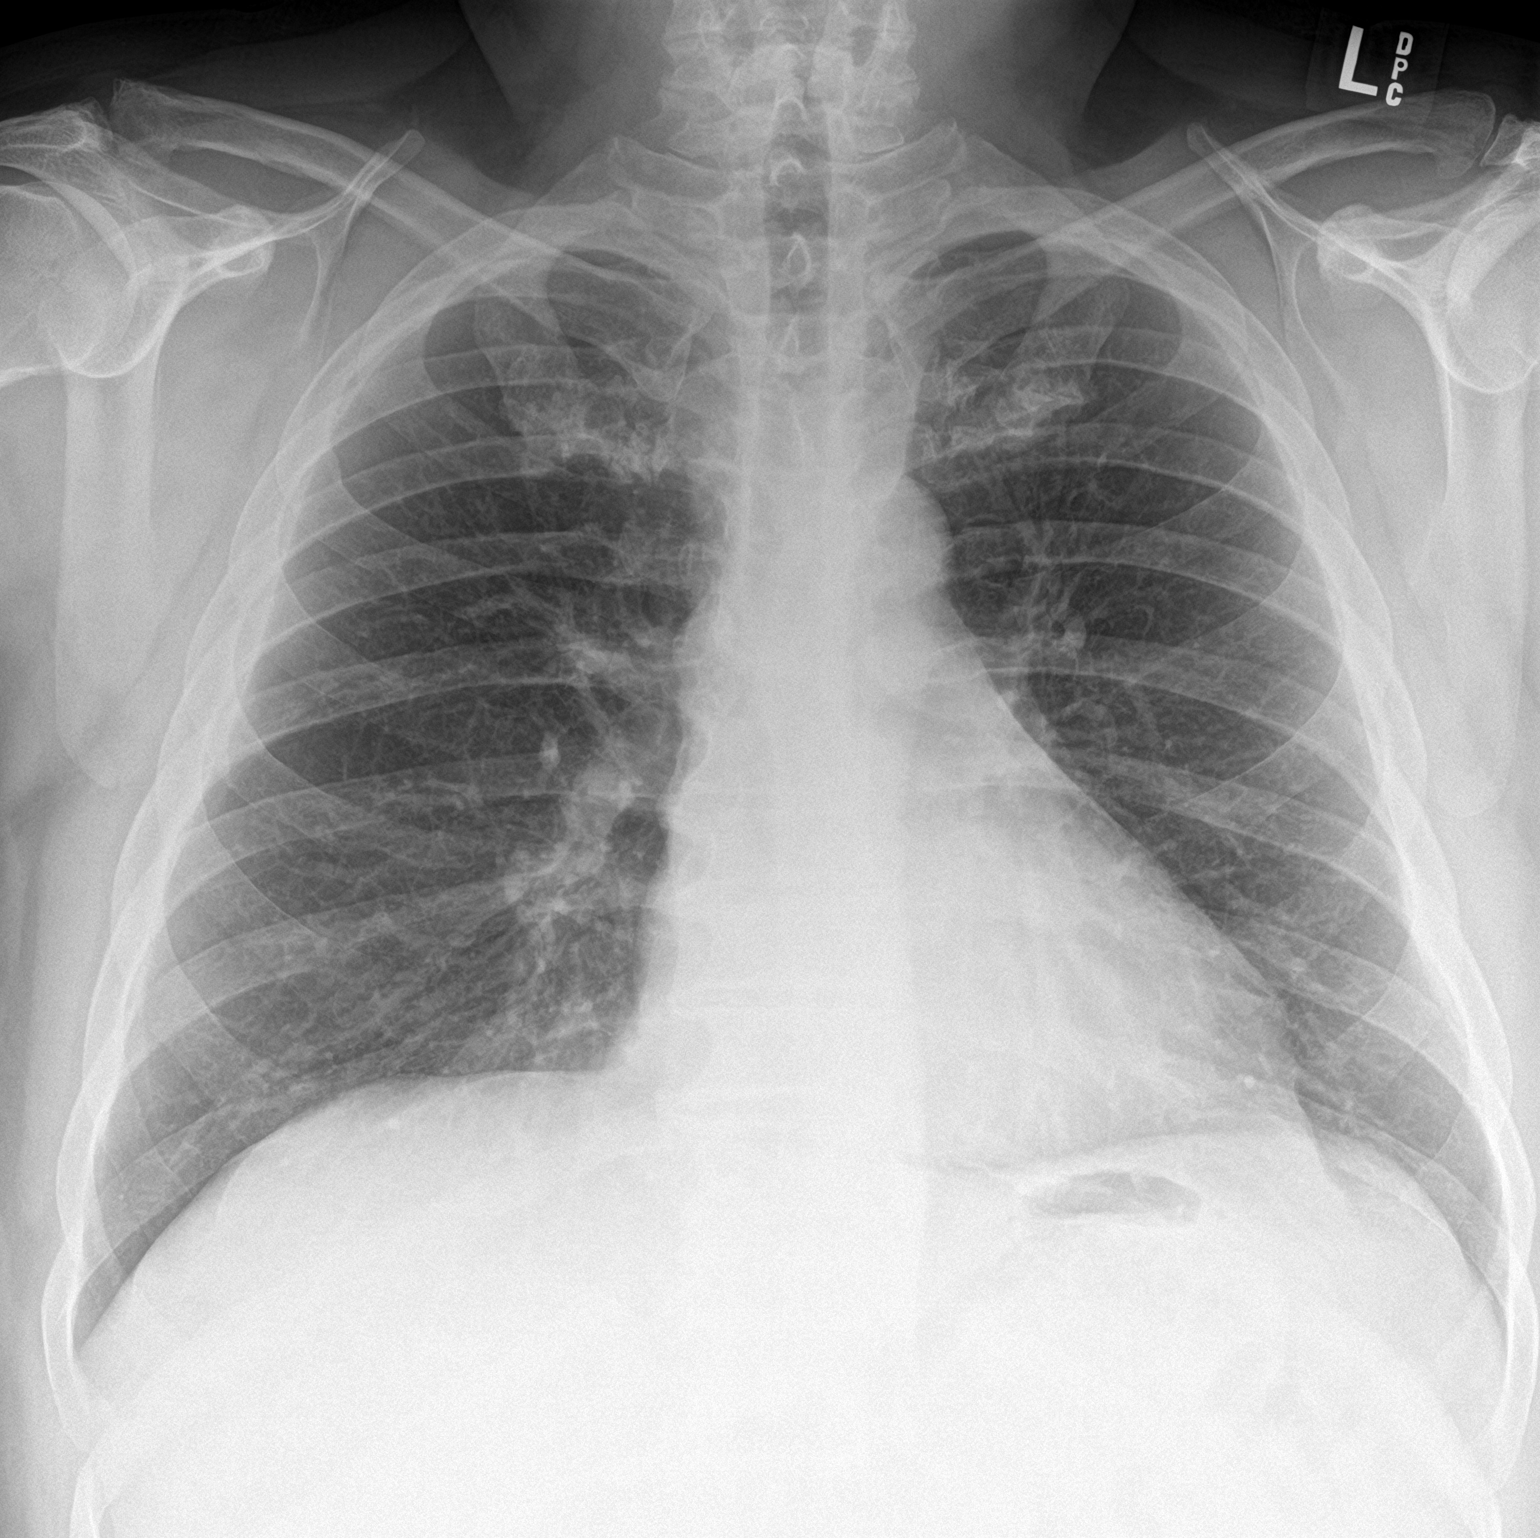

[chest lat]
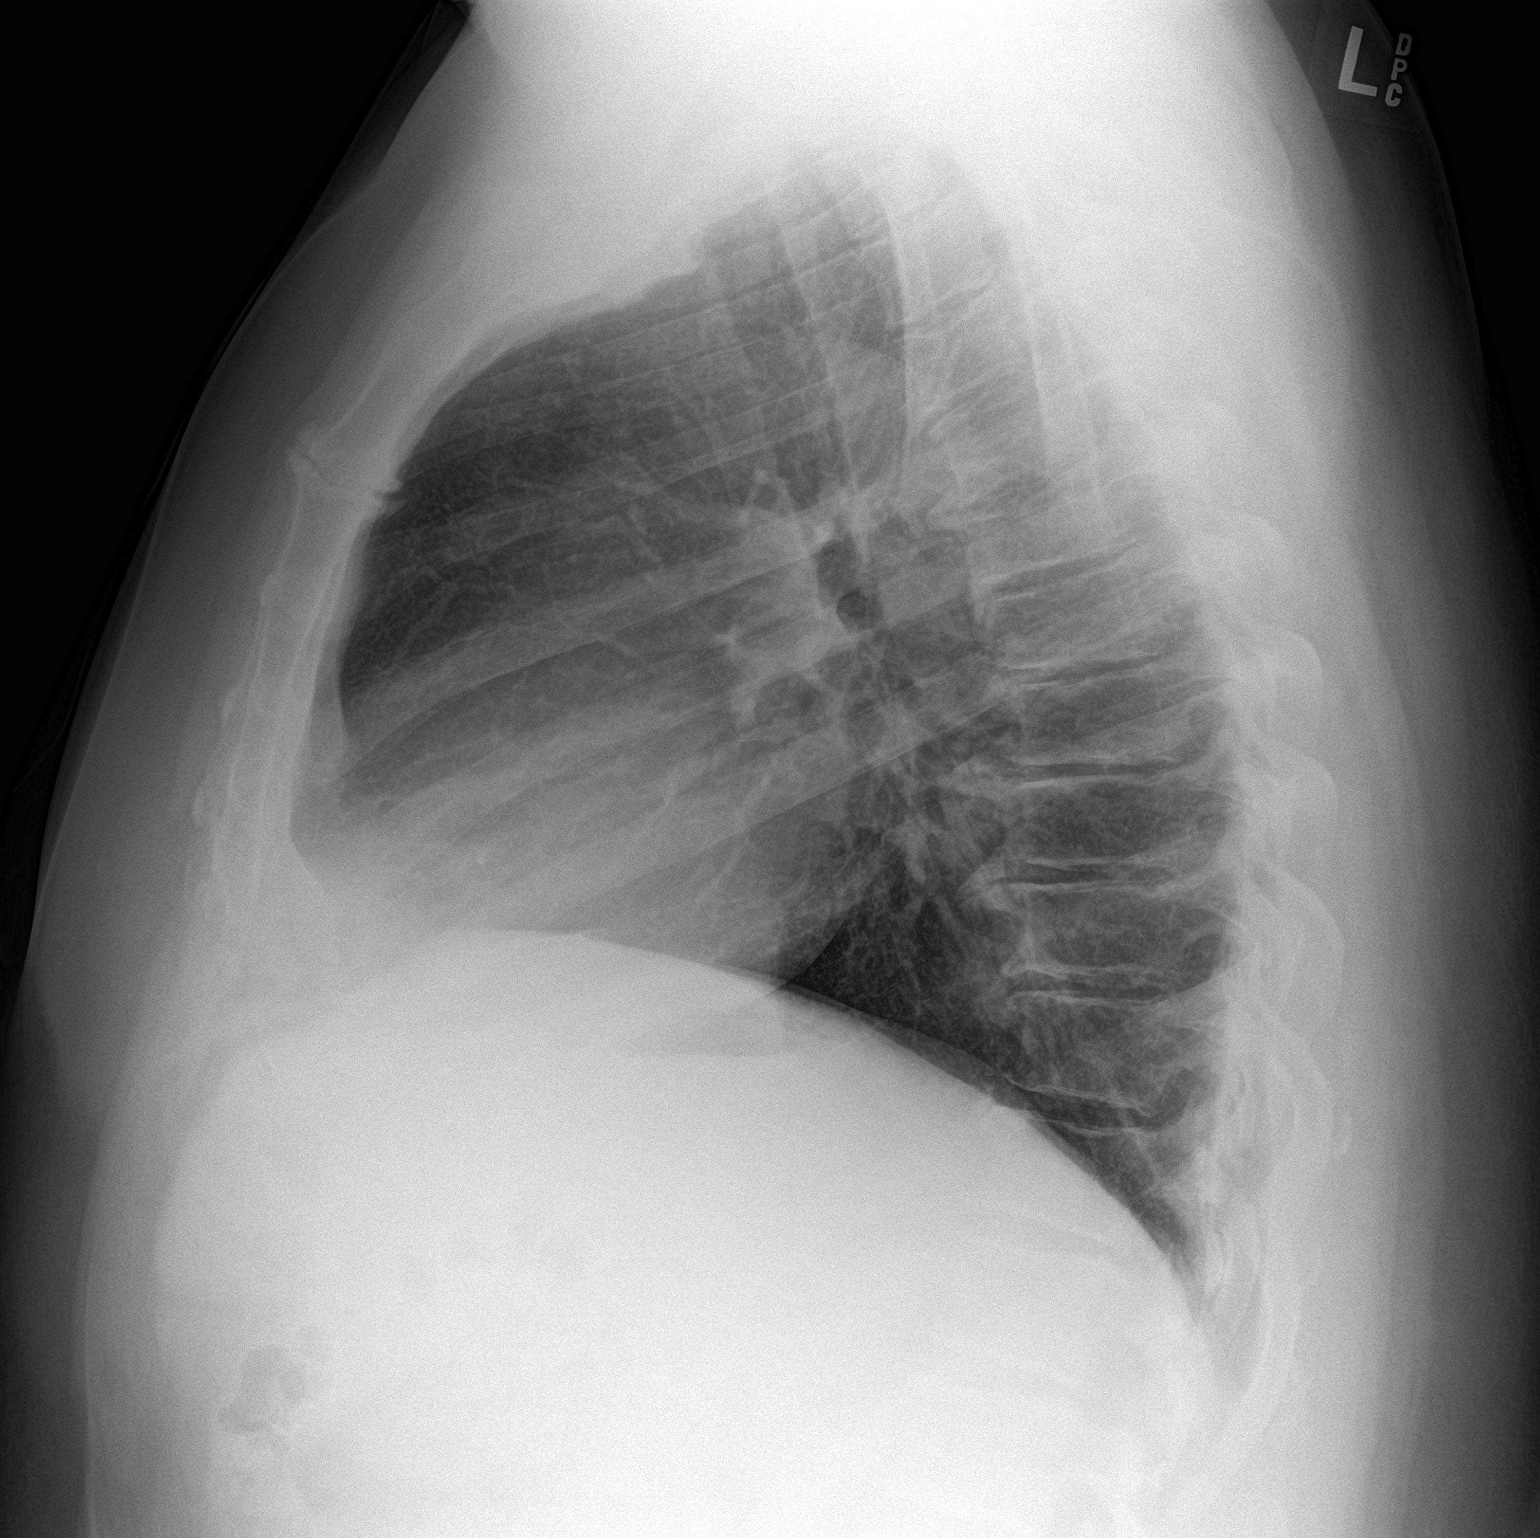

[2 of 2 positions shown; findings below may reference images not displayed]

FINDINGS: The lungs are adequately inflated. There is no focal infiltrate.
There is no pleural effusion. The heart and pulmonary vascularity
are normal. The mediastinum is normal in width. The trachea is
midline. The bony thorax exhibits no acute abnormality.
IMPRESSION: There is no pneumonia, CHF, nor other acute cardiopulmonary
abnormality.

## 2017-09-20 DIAGNOSIS — F39 Unspecified mood [affective] disorder: Secondary | ICD-10-CM | POA: Insufficient documentation

## 2017-11-20 IMAGING — DX DG ELBOW COMPLETE 3+V*R*
3 series · 4 of 4 positions shown · non-contrast
Comparison: None.

CLINICAL DATA: Primary osteoarthritis of right elbow. Right elbow
pain.

EXAM:
RIGHT ELBOW - COMPLETE 3+ VIEW

[elbow ap]
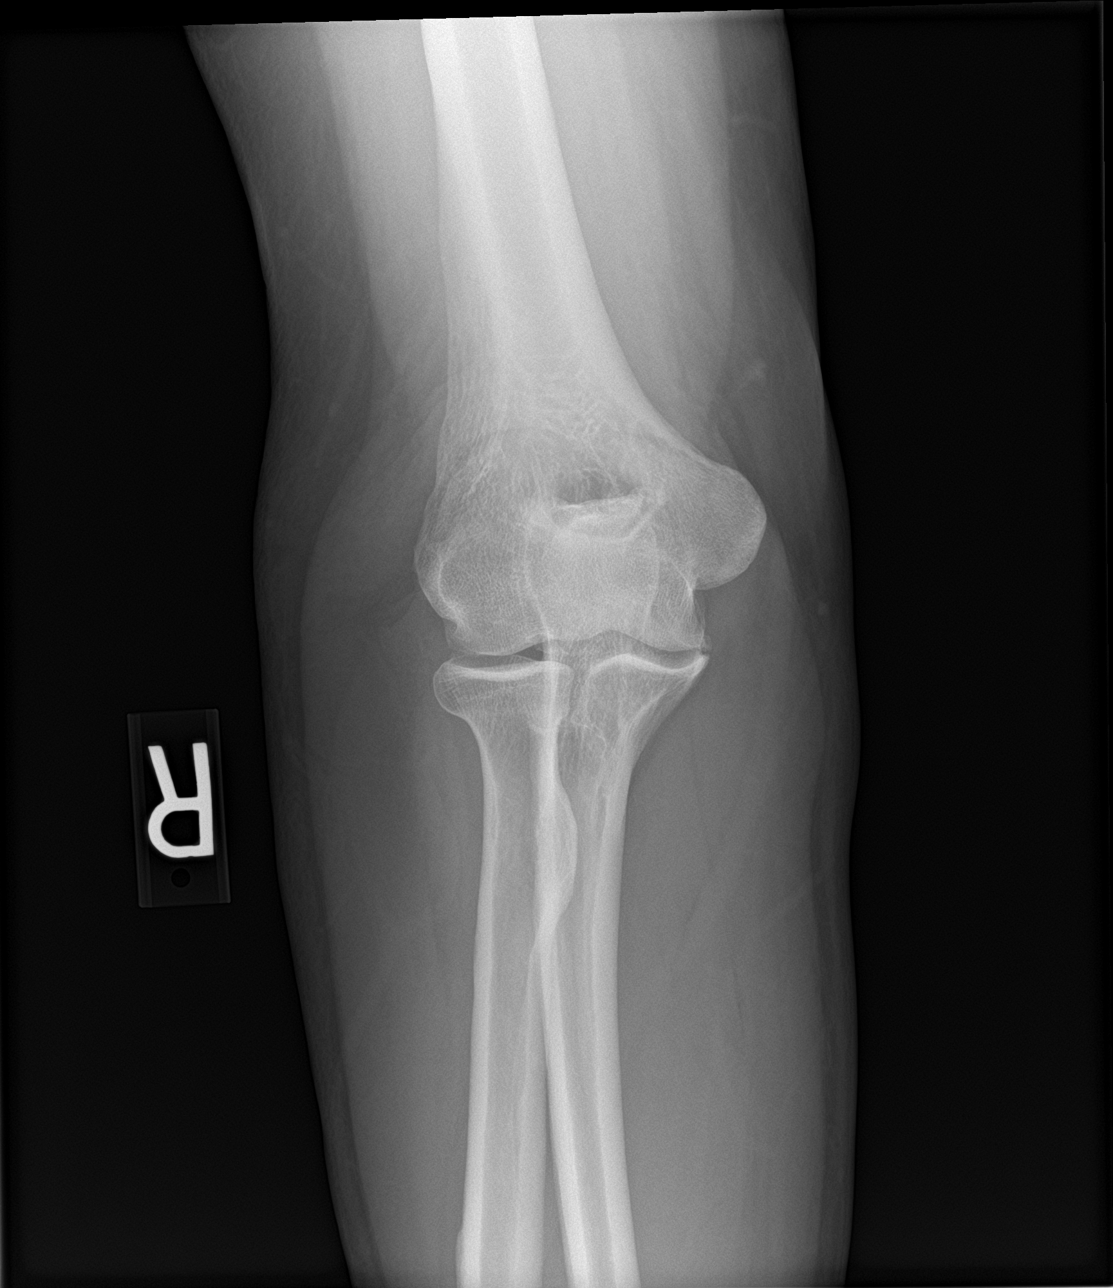

[Series 2: elbow obl · 0.14mm/px · 2 of 2 slices shown]
[im 1/2]
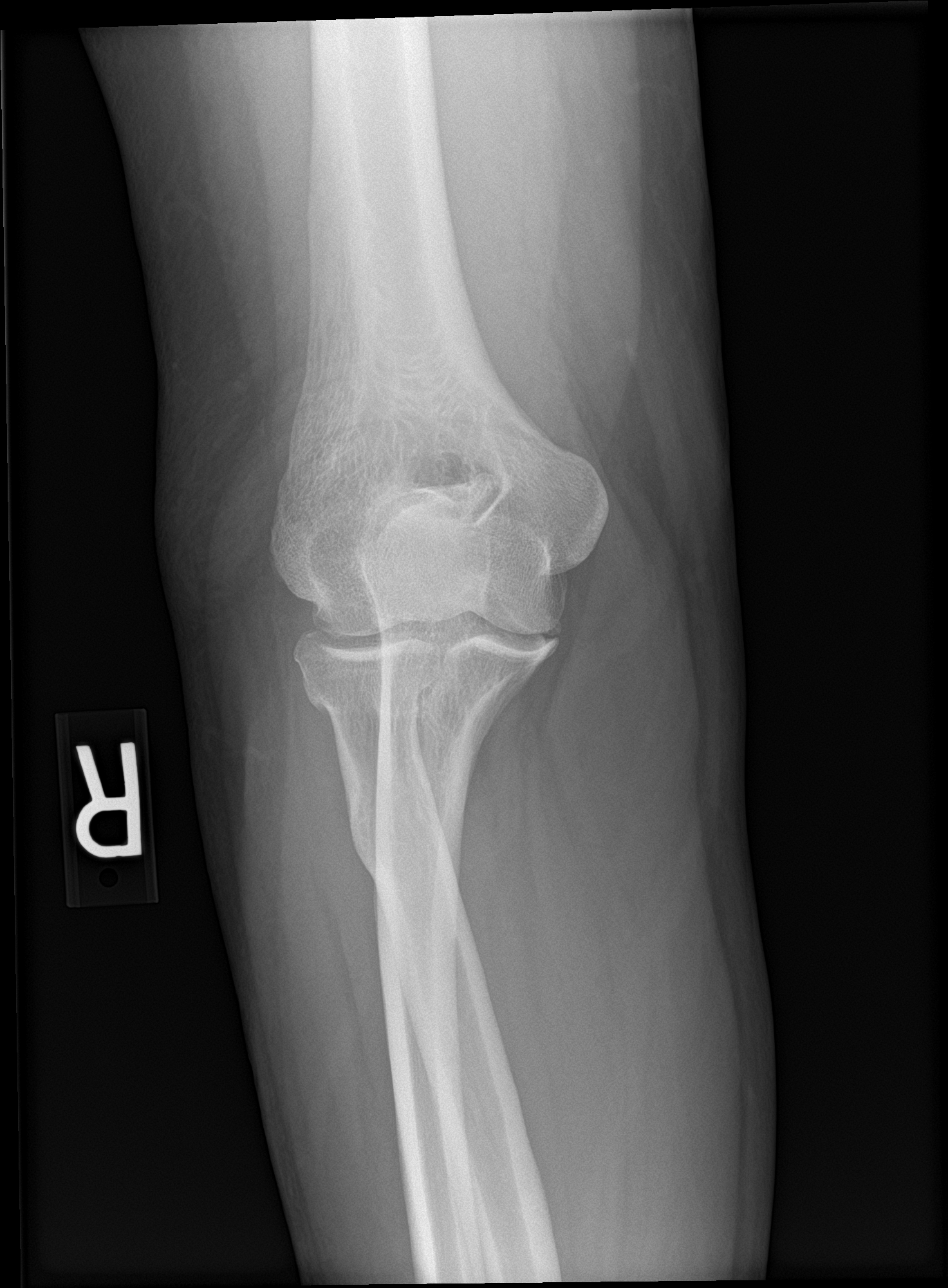
[im 2/2]
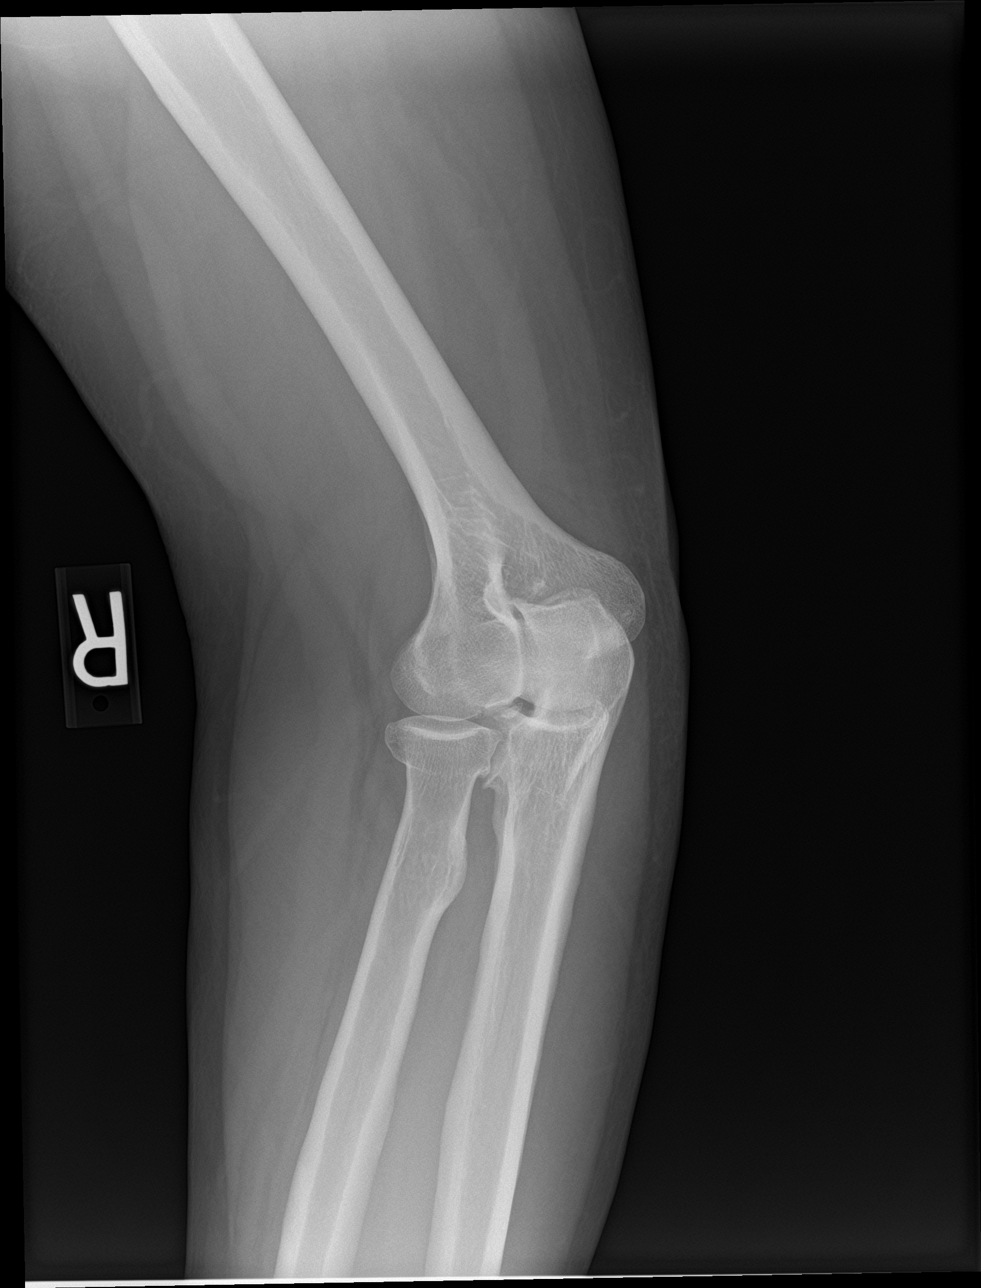

[elbow lat]
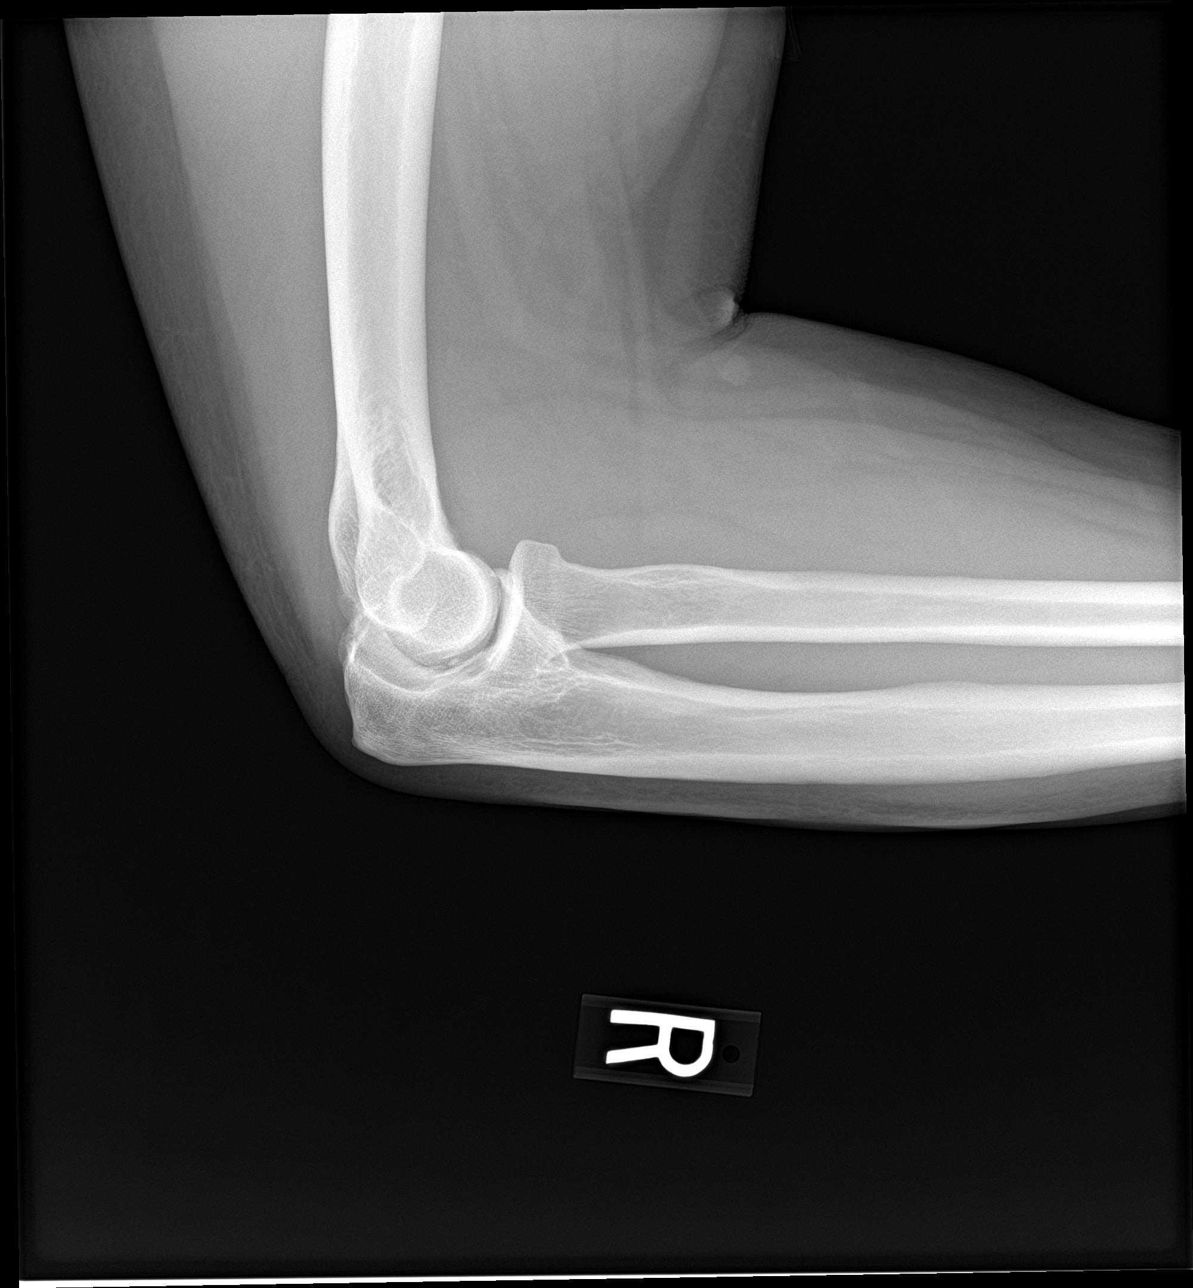

[4 of 4 positions shown; findings below may reference images not displayed]

FINDINGS: No fracture or dislocation.

There is mild spurring from the articular margin of the radial head
and base of the radial head. Some spurring is noted from the
articular margins of the olecranon. No significant joint space
narrowing. No joint effusion.

Soft tissues are unremarkable.
IMPRESSION: 1. No fracture, dislocation or acute finding.
2. Mild marginal spurring consistent with osteoarthritis. Some bony
productive change along the base of the right radial head raises the
possibility of remote trauma.

## 2018-02-02 ENCOUNTER — Encounter: Payer: Self-pay | Admitting: Family Medicine

## 2018-02-02 ENCOUNTER — Ambulatory Visit (INDEPENDENT_AMBULATORY_CARE_PROVIDER_SITE_OTHER): Payer: PRIVATE HEALTH INSURANCE | Admitting: Family Medicine

## 2018-02-02 VITALS — BP 120/74 | HR 84 | Ht 73.0 in | Wt 303.0 lb

## 2018-02-02 DIAGNOSIS — Z6839 Body mass index (BMI) 39.0-39.9, adult: Secondary | ICD-10-CM

## 2018-02-02 DIAGNOSIS — M1A0791 Idiopathic chronic gout, unspecified ankle and foot, with tophus (tophi): Secondary | ICD-10-CM

## 2018-02-02 DIAGNOSIS — R635 Abnormal weight gain: Secondary | ICD-10-CM

## 2018-02-02 DIAGNOSIS — I10 Essential (primary) hypertension: Secondary | ICD-10-CM | POA: Diagnosis not present

## 2018-02-02 DIAGNOSIS — R1013 Epigastric pain: Secondary | ICD-10-CM | POA: Diagnosis not present

## 2018-02-02 DIAGNOSIS — R110 Nausea: Secondary | ICD-10-CM | POA: Diagnosis not present

## 2018-02-02 MED ORDER — AMLODIPINE BESYLATE 10 MG PO TABS: 10.0000 mg | ORAL_TABLET | Freq: Every day | ORAL | 1 refills | Status: DC

## 2018-02-02 MED ORDER — PHENTERMINE HCL 15 MG PO CAPS: 15.0000 mg | ORAL_CAPSULE | ORAL | 0 refills | Status: DC

## 2018-02-02 MED ORDER — ESOMEPRAZOLE MAGNESIUM 40 MG PO PACK
40.0000 mg | PACK | Freq: Every day | ORAL | 2 refills | Status: DC
Start: 2018-02-02 — End: 2018-09-18

## 2018-02-02 NOTE — Progress Notes (Signed)
Subjective:    CC: HTN  HPI:  Hypertension- Pt denies chest pain, SOB, dizziness, or heart palpitations.  Taking meds as directed w/o problems.  Denies medication side effects.  Actually started on blood pressure medication about 3 months ago.  Really did not have any problems until several months ago his wife started checking it and his blood pressure was running as high as a systolic of 920.  He was started on his current medication and says he is actually done really well on it ever since then.  Ultimately his goal is to get some weight off.  He would really like to get back down to about 240 pounds and is hoping that once he gets the weight back off and gets back down to his baseline that his blood pressure will go back under good control.  In fact he wants to talk about options for weight loss and wonders if we could try phentermine again which we had used in the past.  Gout-he did have some recent lab work showing an elevated uric acid level around 9.4.  This was about 3 months ago and he is now been taking allopurinol and Colcrys for the last 3 months and is hopeful that his uric acid is better.    He also complains today of significant nausea.  For several months he is been getting waves of nausea which can last anywhere from a couple minutes to 5 to 10 minutes.  He never actually vomits with it.  Sometimes he will actually get epigastric pain with it and says sometimes he will get epigastric pain even when he bends over he will get a spasm or cramp in the upper abdominal wall.  There are history of diverticulitis.   Past medical history, Surgical history, Family history not pertinant except as noted below, Social history, Allergies, and medications have been entered into the medical record, reviewed, and corrections made.   Review of Systems: No fevers, chills, night sweats, weight loss, chest pain, or shortness of breath.   Objective:    General: Well Developed, well nourished, and in no  acute distress.  Neuro: Alert and oriented x3, extra-ocular muscles intact, sensation grossly intact.  HEENT: Normocephalic, atraumatic  Skin: Warm and dry, no rashes. Cardiac: Regular rate and rhythm, no murmurs rubs or gallops, no lower extremity edema.  Respiratory: Clear to auscultation bilaterally. Not using accessory muscles, speaking in full sentences. Abd: Soft, mildly tender just above the umbilicus and up to the epigastric area.  No masses or guarding.   Impression and Recommendations:   HTN -pressure looks absolutely fantastic today.  I agree I think if he were able to lose a significant amount of weight he would probably make a big impact on his blood pressure for now continue current regimen.  Recent renal function and potassium were normal.  We will get those entered into the labs  Nausea -unclear etiology.  Could be gastritis versus an ulcer.  He said he had a history of an ulcer before this feels a little bit different.  Since we are going to recheck his uric acid today we will also check for pancreatitis.  He has not had any actual vomiting to also consider gallbladder but his pain really is more epigastric and sometimes more to the left versus the right upper abdominal area  Gout -uric acid elevated at 9.4.  Hopefully after 3 months on the colchicine and allopurinol it is improving.  Plan to recheck.  Lab slip provided  today.  HTN - Well controlled. Continue current regimen. Follow up in  6 months.    Abnormal weight gain/BMI 39-obesity-discussed options.  He would like to retry phentermine so we will start him with low-dose 15 mg.  Follow back up in 1 month.  Encouraged to continue to work on portion control and staying active.  Current weight: 303 pounds Previous weight: 297 pounds  change in weight: Up by 6 pounds Goal weight: 240 pounds Exercise goal: No specific strategies discussed today. Dietary goal: Continue to work on portion control. Medication management: We  will start low-dose phentermine. Follow-up: 4 weeks with PCP

## 2018-02-03 ENCOUNTER — Telehealth: Payer: Self-pay

## 2018-02-03 LAB — URIC ACID: URIC ACID, SERUM: 7.1 mg/dL (ref 4.0–8.0)

## 2018-02-03 LAB — LIPASE: LIPASE: 17 U/L (ref 7–60)

## 2018-02-03 LAB — AMYLASE: Amylase: 41 U/L (ref 21–101)

## 2018-02-03 MED ORDER — ALLOPURINOL 300 MG PO TABS: 300.0000 mg | ORAL_TABLET | Freq: Every day | ORAL | 3 refills | Status: DC

## 2018-02-03 NOTE — Addendum Note (Signed)
Addended by: Beatrice Lecher D on: 02/03/2018 09:49 AM   Modules accepted: Orders

## 2018-02-03 NOTE — Telephone Encounter (Signed)
Nexium was sent over to pharmacy as packets. Pharmacy called requesting change to capsules.   Spoke to Dr Madilyn Fireman, RX was not intentionally sent as packets, gave verbal OK to change to capsules.

## 2018-02-20 ENCOUNTER — Telehealth: Payer: Self-pay

## 2018-02-20 MED ORDER — CLINDAMYCIN HCL 300 MG PO CAPS: 300.0000 mg | ORAL_CAPSULE | Freq: Three times a day (TID) | ORAL | 0 refills | Status: DC

## 2018-02-20 NOTE — Telephone Encounter (Signed)
Pt called because he has an infection in one of his molars and is requesting an antibiotic. Pt states he cannot afford to go to dentist to be evaluated just to receive antibiotic. Pt states he has received antibiotics for infected tooth from provider before, and just needs to get infection under control so dentist can pull this tooth.   Told pt usually tooth/mouth infection needs to be evaluated by dentist, but would ask his PCP. Please advise

## 2018-02-20 NOTE — Telephone Encounter (Signed)
Pt advised.

## 2018-02-20 NOTE — Telephone Encounter (Signed)
OK, new rx sent to the needs to get in with his dentist that he can have the tooth extracted.

## 2018-03-03 ENCOUNTER — Ambulatory Visit: Payer: PRIVATE HEALTH INSURANCE | Admitting: Family Medicine

## 2018-03-09 ENCOUNTER — Ambulatory Visit (INDEPENDENT_AMBULATORY_CARE_PROVIDER_SITE_OTHER): Payer: PRIVATE HEALTH INSURANCE

## 2018-03-09 ENCOUNTER — Encounter: Payer: Self-pay | Admitting: Family Medicine

## 2018-03-09 ENCOUNTER — Ambulatory Visit (INDEPENDENT_AMBULATORY_CARE_PROVIDER_SITE_OTHER): Payer: PRIVATE HEALTH INSURANCE | Admitting: Family Medicine

## 2018-03-09 VITALS — BP 120/75 | HR 74 | Temp 98.1°F | Ht 73.0 in | Wt 303.0 lb

## 2018-03-09 DIAGNOSIS — K6389 Other specified diseases of intestine: Secondary | ICD-10-CM

## 2018-03-09 DIAGNOSIS — R635 Abnormal weight gain: Secondary | ICD-10-CM | POA: Diagnosis not present

## 2018-03-09 DIAGNOSIS — K760 Fatty (change of) liver, not elsewhere classified: Secondary | ICD-10-CM | POA: Diagnosis not present

## 2018-03-09 DIAGNOSIS — Z6839 Body mass index (BMI) 39.0-39.9, adult: Secondary | ICD-10-CM

## 2018-03-09 DIAGNOSIS — I700 Atherosclerosis of aorta: Secondary | ICD-10-CM

## 2018-03-09 DIAGNOSIS — R1031 Right lower quadrant pain: Secondary | ICD-10-CM

## 2018-03-09 DIAGNOSIS — Z23 Encounter for immunization: Secondary | ICD-10-CM | POA: Diagnosis not present

## 2018-03-09 DIAGNOSIS — Z87442 Personal history of urinary calculi: Secondary | ICD-10-CM | POA: Diagnosis not present

## 2018-03-09 LAB — POCT URINALYSIS DIPSTICK
Bilirubin, UA: NEGATIVE
GLUCOSE UA: NEGATIVE
Ketones, UA: NEGATIVE
LEUKOCYTES UA: NEGATIVE
Nitrite, UA: NEGATIVE
PH UA: 7.5 (ref 5.0–8.0)
PROTEIN UA: NEGATIVE
RBC UA: NEGATIVE
Spec Grav, UA: 1.02 (ref 1.010–1.025)
Urobilinogen, UA: 1 E.U./dL

## 2018-03-09 MED ORDER — PHENTERMINE HCL 15 MG PO CAPS: 15.0000 mg | ORAL_CAPSULE | ORAL | 0 refills | Status: DC

## 2018-03-09 NOTE — Progress Notes (Signed)
Subjective:    Patient ID: Thomas Pineda, male    DOB: 1971-02-04, 47 y.o.   MRN: [id number]  HPI 1 mo f/u for abnormal weight gain -even though he has not lost weight on the scale he actually feels like he has physically lost some weight he feels like his pants are actually fitting more loosely.  And overall he feels good.  He is tolerating the medication well without any side effects including chest pain or palpitations.  Like to continue with the medication.  When I last saw him he was experiencing some nausea and more epigastric pain in fact we did pancreatic enzymes which were normal.  He says the nausea is actually a little better but not completely gone and the epigastric pain seems to be much better but now he is been experiencing some right lower quadrant pain.  He said initially it was more right mid abdomen and now has moved more down to the right lower quadrant.  He says initially it started about 5 or 6 weeks ago it was happening pretty consistently around that time then it seemed to get better but in the last few days it started to ramp back up again.  He has not had any dysuria or hematuria.  He says the pain is typically sharp and sudden and will last a minute or 2 and then actually eased off.  In the last couple days he is been having up to 3 episodes per day.  In fact couple days ago it was severe enough that he almost went to the emergency department because it almost doubled him over.  He denies any fevers or chills.  He denies any constipation and says he goes regularly.  He denies any blood in his stool. He is taking his nexium.    Review of Systems  BP 120/75   Pulse 74   Temp 98.1 F (36.7 C)   Ht 6\' 1"  (1.854 m)   Wt (!) 303 lb (137.4 kg)   SpO2 98%   BMI 39.98 kg/m     Allergies  Allergen Reactions  . Penicillins Rash    Has patient had a PCN reaction causing immediate rash, facial/tongue/throat swelling, SOB or lightheadedness with hypotension:No  Has patient had  a PCN reaction causing severe rash involving mucus membranes or skin necrosis:Yes (head to toe) Has patient had a PCN reaction that required hospitalization:No Has patient had a PCN reaction occurring within the last 10 years:No If all of the above answers are "NO", then may proceed with Cephalosporin use.     Past Medical History:  Diagnosis Date  . Abscess of buttock   . Abscessed tooth   . Alcoholism ([place])   . Anxiety   . Arthritis    l hip l knee  . Cancer (HCC)    Squamous cell r forearm removed  . Depression   . Diverticulitis   . Dysrhythmia    hx of extra beats  . GERD (gastroesophageal reflux disease)    hx of   . Gout   . HLD (hyperlipidemia)   . Hypertension    No medications  . Palpitations   . Peptic ulcer   . Skin abnormalities     Past Surgical History:  Procedure Laterality Date  . HAND SURGERY Right 1999  . LAPAROSCOPIC PARTIAL COLECTOMY N/A 08/09/2016   Procedure: LAPAROSCOPIC PARTIAL COLECTOMY WITH ANASTOMOSIS;  Surgeon: Arta Bruce Kinsinger, MD;  Location: [facility];  Service: General;  Laterality: N/A;  .  SQUAMOUS CELL CARCINOMA EXCISION Right 07/21/2011   forearm    Social History   Socioeconomic History  . Marital status: Married    Spouse name: Not on file  . Number of children: 1  . Years of education: Not on file  . Highest education level: Not on file  Occupational History  . Occupation: Programmer, systems: [employer]    Comment: [employer]  . Financial resource strain: Not on file  . Food insecurity:    Worry: Not on file    Inability: Not on file  . Transportation needs:    Medical: Not on file    Non-medical: Not on file  Tobacco Use  . Smoking status: Former Smoker    Last attempt to quit: 07/29/2013    Years since quitting: 4.6  . Smokeless tobacco: Never Used  . Tobacco comment: uses e-cig  Substance and Sexual Activity  . Alcohol use: No    Comment: occasional-quit but still have a couple  .  Drug use: Yes    Types: Marijuana    Comment: Marijuana occasional  . Sexual activity: Yes    Partners: Female  Lifestyle  . Physical activity:    Days per week: Not on file    Minutes per session: Not on file  . Stress: Not on file  Relationships  . Social connections:    Talks on phone: Not on file    Gets together: Not on file    Attends religious service: Not on file    Active member of club or organization: Not on file    Attends meetings of clubs or organizations: Not on file    Relationship status: Not on file  . Intimate partner violence:    Fear of current or ex partner: Not on file    Emotionally abused: Not on file    Physically abused: Not on file    Forced sexual activity: Not on file  Other Topics Concern  . Not on file  Social History Narrative   Does not get regular exercise.      Family History  Problem Relation Age of Onset  . Hypertension Father   . Renal cancer Father   . Alcohol abuse Father   . Heart disease Father        atrial fibrillation  . Diabetes Father   . Brain cancer Paternal Aunt   . Diabetes Mother   . Breast cancer Maternal Grandmother   . Ulcerative colitis Maternal Grandmother   . Liver disease Maternal Grandmother   . Diabetes Maternal Grandfather   . Heart disease Maternal Grandfather   . Throat cancer Paternal Grandfather   . Lung cancer Paternal Grandfather   . Colon polyps Maternal Aunt   . Irritable bowel syndrome Cousin        paternal    Outpatient Encounter Medications as of 03/09/2018  Medication Sig  . allopurinol (ZYLOPRIM) 300 MG tablet Take 1 tablet (300 mg total) by mouth daily.  Marland Kitchen amLODipine (NORVASC) 10 MG tablet Take 1 tablet (10 mg total) by mouth daily.  Marland Kitchen esomeprazole (NEXIUM) 40 MG packet Take 40 mg by mouth daily before breakfast.  . ibuprofen (ADVIL,MOTRIN) 800 MG tablet Take 1 tablet (800 mg total) by mouth every 8 (eight) hours as needed.  . phentermine 15 MG capsule Take 1 capsule (15 mg total) by  mouth every morning.  . [DISCONTINUED] phentermine 15 MG capsule Take 1 capsule (15 mg total) by mouth  every morning.  . [DISCONTINUED] clindamycin (CLEOCIN) 300 MG capsule Take 1 capsule (300 mg total) by mouth 3 (three) times daily.   No facility-administered encounter medications on file as of 03/09/2018.          Objective:   Physical Exam  Constitutional: He is oriented to person, place, and time. He appears well-developed and well-nourished.  HENT:  Head: Normocephalic and atraumatic.  Eyes: Conjunctivae and EOM are normal.  Cardiovascular: Normal rate.  Pulmonary/Chest: Effort normal.  Abdominal: Soft. Bowel sounds are normal. He exhibits no distension and no mass. There is tenderness. There is no rebound and no guarding. No hernia.  Mild tenderness in the RLQ.   Neurological: He is alert and oriented to person, place, and time.  Skin: Skin is dry. No pallor.  Psychiatric: He has a normal mood and affect. His behavior is normal.  Vitals reviewed.       Assessment & Plan:  Abnormal weight gain/BMI 39 -weight is stable.  But he does feel like he has lost weight.  We will continue phentermine for 1 more month and have him follow-up for blood pressure and weight check at that time.  He was really doing fantastic with great momentum.  He feels like he is sticking to his dietary changes.  Current weight: 303 pounds Previous weight: 303 pounds  Change in weight: no changes Goal weight: 240 pounds Exercise goal: Is a physically active job. Dietary goal: Continue to work on portion control. Medication management: We will start low-dose phentermine. Follow-up: 4 weeks with PCP/or nurse.    RLQ pain  -consider renal stone versus appendicitis.  After discussion with radiology about which was the best test to consider I am more worried about potential for kidney stones we will go with CT stone renal protocol.  We will get that approved stat and get him down today for further  evaluation.  CT was essentially negative for kidney stone or appendicitis still not clear exactly what is causing his pain he feels like his bowels are moving quite normally.  I will have him just keep an eye on it over the weekend and see if it persists if so then consider further work-up by referral to GI.  They did notes a herniated disc in the lumbar spine but I really do not think that that is causing his pain based on the description and location.  Note, the CT was without contrast so it cannot 100% rule out appendicitis but definitely any more severe type symptoms or perforation should be ruled out with a CT without contrast.

## 2018-04-06 ENCOUNTER — Ambulatory Visit (INDEPENDENT_AMBULATORY_CARE_PROVIDER_SITE_OTHER): Payer: PRIVATE HEALTH INSURANCE | Admitting: Family Medicine

## 2018-04-06 VITALS — BP 135/89 | HR 67 | Ht 73.0 in | Wt 291.0 lb

## 2018-04-06 DIAGNOSIS — R635 Abnormal weight gain: Secondary | ICD-10-CM

## 2018-04-06 DIAGNOSIS — Z6838 Body mass index (BMI) 38.0-38.9, adult: Secondary | ICD-10-CM

## 2018-04-06 MED ORDER — PHENTERMINE HCL 15 MG PO CAPS: 15.0000 mg | ORAL_CAPSULE | ORAL | 0 refills | Status: DC

## 2018-04-06 NOTE — Progress Notes (Signed)
Thomas Pineda is here for a weight check.  Denies palpitations, trouble sleeping, and any other issues with medication.  Pt down 12 lbs. He stated that he usually have issues with stimulate medications but is not having any with phentermine.  He is asking could the does be increased?  Pt advised to make next appointment in 4 weeks.  Will route to PCP for review. -EH/RMA

## 2018-04-06 NOTE — Progress Notes (Signed)
Abnormal weight gain-if he has lost 12 pounds in a month and I do not want to increase the medication.  I do not want him losing the weight too rapidly or it can increase his risk for gallstones etc.  Will refill current medication.  Can follow-up in 1 month with nurse visit.  And then after that we will follow-up with PCP. BP was borderline so will monitor.    Beatrice Lecher, MD

## 2018-05-08 ENCOUNTER — Ambulatory Visit: Payer: PRIVATE HEALTH INSURANCE

## 2018-05-12 ENCOUNTER — Ambulatory Visit: Payer: PRIVATE HEALTH INSURANCE

## 2018-05-15 ENCOUNTER — Ambulatory Visit (INDEPENDENT_AMBULATORY_CARE_PROVIDER_SITE_OTHER): Payer: PRIVATE HEALTH INSURANCE | Admitting: Family Medicine

## 2018-05-15 VITALS — BP 143/88 | HR 85 | Temp 97.9°F | Wt 286.0 lb

## 2018-05-15 DIAGNOSIS — R635 Abnormal weight gain: Secondary | ICD-10-CM

## 2018-05-15 MED ORDER — PHENTERMINE HCL 15 MG PO CAPS: 15.0000 mg | ORAL_CAPSULE | ORAL | 0 refills | Status: DC

## 2018-05-15 NOTE — Progress Notes (Signed)
Agree with above.  I will go ahead and refill the medication but will need to monitor blood pressure carefully make sure that is not a side effect of medication.  Again it did go ahead and refill for 30 days.  Follow-up in 1 month.

## 2018-05-15 NOTE — Progress Notes (Signed)
Pt in office today for weight check. Patients weight at last visit was 291 lb. Todays weight was 286 lb. Patients bllod pressure was 143/88, he stated he would be high due to personal conflict this morning.Advised pt I would forward to provider for review , and for medication to be refilled.

## 2018-06-14 ENCOUNTER — Ambulatory Visit: Payer: PRIVATE HEALTH INSURANCE

## 2018-06-30 ENCOUNTER — Ambulatory Visit (INDEPENDENT_AMBULATORY_CARE_PROVIDER_SITE_OTHER): Payer: PRIVATE HEALTH INSURANCE | Admitting: Family Medicine

## 2018-06-30 VITALS — BP 127/76 | HR 88 | Temp 98.4°F | Wt 296.0 lb

## 2018-06-30 DIAGNOSIS — R635 Abnormal weight gain: Secondary | ICD-10-CM

## 2018-06-30 DIAGNOSIS — Z6838 Body mass index (BMI) 38.0-38.9, adult: Secondary | ICD-10-CM | POA: Diagnosis not present

## 2018-06-30 MED ORDER — PHENTERMINE HCL 37.5 MG PO CAPS: 37.5000 mg | ORAL_CAPSULE | ORAL | 0 refills | Status: DC

## 2018-06-30 NOTE — Progress Notes (Signed)
Pt in today for BP and wt check. Pts BP today was 127/76 and pulse 88. Pts weight was 296 lb. Pt stated he knew he had gained due to the holidays. Pt  Needs refill sent to phasrmacy, will forward to provider for review. RX pending

## 2018-06-30 NOTE — Progress Notes (Signed)
Abnormal weight gain/BMI of 38-he gained almost 10 pounds over the holidays.  I will refill the medication 1 more time but if he is unable to lose weight then he will need to schedule an appointment so that we can discuss alternative options.  Beatrice Lecher, MD

## 2018-07-28 ENCOUNTER — Ambulatory Visit: Payer: PRIVATE HEALTH INSURANCE

## 2018-08-18 ENCOUNTER — Ambulatory Visit (INDEPENDENT_AMBULATORY_CARE_PROVIDER_SITE_OTHER): Payer: PRIVATE HEALTH INSURANCE | Admitting: Family Medicine

## 2018-08-18 DIAGNOSIS — R635 Abnormal weight gain: Secondary | ICD-10-CM | POA: Diagnosis not present

## 2018-08-18 MED ORDER — PHENTERMINE HCL 37.5 MG PO CAPS: 37.5000 mg | ORAL_CAPSULE | ORAL | 0 refills | Status: DC

## 2018-08-18 NOTE — Progress Notes (Signed)
Agree with documentation as above.  In addition he needs to schedule his follow-up with me for next visit as he has not seen September and he is supposed to follow-up with the PCP every 3 months which would have been December.  He should not be put back on the nurse schedule place.  Beatrice Lecher, MD

## 2018-08-18 NOTE — Progress Notes (Signed)
Patient is here for blood pressure and weight check. Denies any trouble sleeping, palpitations, or any other medication problems. Patient has not lost weight. A refill for Phentermine will be sent to Provider for review. Patient advised to set a goal for at least 4 pounds weight loss over the next month and to schedule a four week nurse visit. Verbalized understanding, no further questions.

## 2018-08-21 NOTE — Progress Notes (Signed)
Left VM for Pt with update. Follow up moved from NV to PCP OV. Same day, same time.

## 2018-09-18 ENCOUNTER — Encounter: Payer: Self-pay | Admitting: Family Medicine

## 2018-09-18 ENCOUNTER — Telehealth (INDEPENDENT_AMBULATORY_CARE_PROVIDER_SITE_OTHER): Payer: PRIVATE HEALTH INSURANCE | Admitting: Family Medicine

## 2018-09-18 ENCOUNTER — Other Ambulatory Visit: Payer: Self-pay

## 2018-09-18 ENCOUNTER — Ambulatory Visit: Payer: PRIVATE HEALTH INSURANCE

## 2018-09-18 VITALS — BP 132/85 | Temp 98.9°F | Wt 284.0 lb

## 2018-09-18 DIAGNOSIS — M109 Gout, unspecified: Secondary | ICD-10-CM | POA: Insufficient documentation

## 2018-09-18 DIAGNOSIS — R635 Abnormal weight gain: Secondary | ICD-10-CM | POA: Insufficient documentation

## 2018-09-18 DIAGNOSIS — K219 Gastro-esophageal reflux disease without esophagitis: Secondary | ICD-10-CM

## 2018-09-18 DIAGNOSIS — Z6837 Body mass index (BMI) 37.0-37.9, adult: Secondary | ICD-10-CM

## 2018-09-18 DIAGNOSIS — M1A0791 Idiopathic chronic gout, unspecified ankle and foot, with tophus (tophi): Secondary | ICD-10-CM

## 2018-09-18 MED ORDER — ESOMEPRAZOLE MAGNESIUM 40 MG PO PACK: 40.0000 mg | PACK | Freq: Every day | ORAL | 1 refills | Status: DC

## 2018-09-18 MED ORDER — ALLOPURINOL 300 MG PO TABS: 300.0000 mg | ORAL_TABLET | Freq: Every day | ORAL | 3 refills | Status: DC

## 2018-09-18 MED ORDER — PHENTERMINE HCL 37.5 MG PO CAPS: 37.5000 mg | ORAL_CAPSULE | ORAL | 0 refills | Status: DC

## 2018-09-18 NOTE — Patient Instructions (Signed)
Follow up in 1 months

## 2018-09-18 NOTE — Progress Notes (Signed)
Virtual Visit via Telephone Note  I connected with Thomas Pineda on 09/18/18 at  3:40 PM EDT by telephone and verified that I am speaking with the correct person using two identifiers.   I discussed the limitations, risks, security and privacy concerns of performing an evaluation and management service by telephone and the availability of in person appointments. I also discussed with the patient that there may be a patient responsible charge related to this service. The patient expressed understanding and agreed to proceed.   GERD - he only uses the NEXIUm, only using once a week.    History of Present Illness:  Abnormal weight gain - No CP, SOB, no insomnia.  Avoiding caffeine.  Mostly drinks water.   Gout - he is doing well. He has run out of allopurinol so he has been using it PRN.     Observations/Objective: Not examined since over the phone.    Assessment and Plan:  Abnormal weight gain - continue with phentermine. He is dong well and is asymptomatic.   Dietary goal: trying to cut out junk food. Has been trying to make healthy choices.  Exercise goal: trying to exercise more.    Gout - explained that he really needs to be on it daily. RF the allopurinol.   GERD - Encouraged weight loss.  Will refill the NEXIUm   Follow Up Instructions:    I discussed the assessment and treatment plan with the patient. The patient was provided an opportunity to ask questions and all were answered. The patient agreed with the plan and demonstrated an understanding of the instructions.   The patient was advised to call back or seek an in-person evaluation if the symptoms worsen or if the condition fails to improve as anticipated.  I provided 15 minutes of non-face-to-face time during this encounter.   Beatrice Lecher, MD

## 2019-03-06 ENCOUNTER — Telehealth: Payer: Self-pay | Admitting: Family Medicine

## 2019-03-06 ENCOUNTER — Encounter: Payer: Self-pay | Admitting: Family Medicine

## 2019-03-06 ENCOUNTER — Other Ambulatory Visit: Payer: Self-pay

## 2019-03-06 ENCOUNTER — Ambulatory Visit (INDEPENDENT_AMBULATORY_CARE_PROVIDER_SITE_OTHER): Payer: PRIVATE HEALTH INSURANCE | Admitting: Family Medicine

## 2019-03-06 VITALS — BP 162/93 | HR 85 | Temp 98.6°F | Ht 73.0 in | Wt 306.0 lb

## 2019-03-06 DIAGNOSIS — Z87898 Personal history of other specified conditions: Secondary | ICD-10-CM | POA: Insufficient documentation

## 2019-03-06 DIAGNOSIS — M2550 Pain in unspecified joint: Secondary | ICD-10-CM | POA: Diagnosis not present

## 2019-03-06 DIAGNOSIS — M1A0791 Idiopathic chronic gout, unspecified ankle and foot, with tophus (tophi): Secondary | ICD-10-CM

## 2019-03-06 DIAGNOSIS — R351 Nocturia: Secondary | ICD-10-CM | POA: Diagnosis not present

## 2019-03-06 DIAGNOSIS — M25552 Pain in left hip: Secondary | ICD-10-CM

## 2019-03-06 DIAGNOSIS — F319 Bipolar disorder, unspecified: Secondary | ICD-10-CM | POA: Insufficient documentation

## 2019-03-06 DIAGNOSIS — I10 Essential (primary) hypertension: Secondary | ICD-10-CM

## 2019-03-06 DIAGNOSIS — Z23 Encounter for immunization: Secondary | ICD-10-CM | POA: Diagnosis not present

## 2019-03-06 DIAGNOSIS — N401 Enlarged prostate with lower urinary tract symptoms: Secondary | ICD-10-CM

## 2019-03-06 DIAGNOSIS — K5790 Diverticulosis of intestine, part unspecified, without perforation or abscess without bleeding: Secondary | ICD-10-CM | POA: Insufficient documentation

## 2019-03-06 LAB — BASIC METABOLIC PANEL
BUN: 11 (ref 4–21)
Creatinine: 0.8 (ref 0.6–1.3)
Glucose: 106
Potassium: 4.5 (ref 3.4–5.3)
Sodium: 137 (ref 137–147)

## 2019-03-06 LAB — POC HEMOCCULT BLD/STL (OFFICE/1-CARD/DIAGNOSTIC): Fecal Occult Blood, POC: NEGATIVE

## 2019-03-06 LAB — HEPATIC FUNCTION PANEL
ALT: 38 (ref 10–40)
AST: 24 (ref 14–40)
Alkaline Phosphatase: 78 (ref 25–125)
Bilirubin, Total: 0.4

## 2019-03-06 MED ORDER — ALLOPURINOL 300 MG PO TABS: 300.0000 mg | ORAL_TABLET | Freq: Every day | ORAL | 3 refills | Status: DC

## 2019-03-06 MED ORDER — ALLOPURINOL 300 MG PO TABS: 300.0000 mg | ORAL_TABLET | Freq: Every day | ORAL | 3 refills | Status: AC

## 2019-03-06 MED ORDER — AMLODIPINE BESYLATE 10 MG PO TABS: 10.0000 mg | ORAL_TABLET | Freq: Every day | ORAL | 1 refills | Status: AC

## 2019-03-06 MED ORDER — TAMSULOSIN HCL 0.4 MG PO CAPS: 0.4000 mg | ORAL_CAPSULE | Freq: Every day | ORAL | 0 refills | Status: DC

## 2019-03-06 NOTE — Telephone Encounter (Signed)
Please call pt and see where has to go for xrays. I would still like to get xray of hir left hip.  I can fax order wot wherever he needs to go.

## 2019-03-06 NOTE — Telephone Encounter (Signed)
Left pt msg to call back and let us know where orders need to be sent

## 2019-03-06 NOTE — Progress Notes (Signed)
Acute Office Visit  Subjective:    Patient ID: Thomas Pineda, male    DOB: September 09, 1970, 48 y.o.   MRN: [id number]  Chief Complaint  Patient presents with  . Prostate Check  . Joint Pain    HPI Patient is in today for prostate issues.  He says for couple years he is noticed he has had gradually increasing urinary frequency and urgency.  He is also noticed that it takes longer to start urination at times.  He says it is gotten to the point now although where it is getting a little frustrating.  On average he gets up about 3 times a night and sometimes as many as 5 times a night.  Also as soon as he feels like he needs to urinate he gets a strong urgency.  Once in a while he will feel like he almost gets like a bladder spasm.  He has not had a prostate exam or a blood test for PSA in the past as he is only 48.  He also complains of arthritis particularly in his elbows.  He has been diagnosed with tennis elbow previously by 1 of our sports medicine providers and has had injections he just feels like they are getting worse he also has persistent intermittent pain in the left knee and more gradually more bothersome is is left hip.  Again it is been getting more severe to the point that he has been taking NyQuil at bedtime just to sedate him so that he can get comfortable and get a sling.  He says is getting more bothersome when he walks a lot.  The hip pain is been there for a few years.  He also has a history of gout and has been out of his allopurinol for couple months.  He says every time he and his wife call the pharmacy they say they do not have it in stock.  He has to use the [facility] pharmacy because of his health insurance.    Past Medical History:  Diagnosis Date  . Abscess of buttock   . Abscessed tooth   . Alcoholism ([place])   . Anxiety   . Arthritis    l hip l knee  . Cancer (HCC)    Squamous cell r forearm removed  . Depression   . Diverticulitis   . Dysrhythmia    hx  of extra beats  . GERD (gastroesophageal reflux disease)    hx of   . Gout   . HLD (hyperlipidemia)   . Hypertension    No medications  . Palpitations   . Peptic ulcer   . Skin abnormalities     Past Surgical History:  Procedure Laterality Date  . HAND SURGERY Right 1999  . LAPAROSCOPIC PARTIAL COLECTOMY N/A 08/09/2016   Procedure: LAPAROSCOPIC PARTIAL COLECTOMY WITH ANASTOMOSIS;  Surgeon: Arta Bruce Kinsinger, MD;  Location: [facility];  Service: General;  Laterality: N/A;  . SQUAMOUS CELL CARCINOMA EXCISION Right 07/21/2011   forearm    Family History  Problem Relation Age of Onset  . Hypertension Father   . Renal cancer Father   . Alcohol abuse Father   . Heart disease Father        atrial fibrillation  . Diabetes Father   . Brain cancer Paternal Aunt   . Diabetes Mother   . Breast cancer Maternal Grandmother   . Ulcerative colitis Maternal Grandmother   . Liver disease Maternal Grandmother   . Diabetes Maternal Grandfather   .  Heart disease Maternal Grandfather   . Throat cancer Paternal Grandfather   . Lung cancer Paternal Grandfather   . Colon polyps Maternal Aunt   . Irritable bowel syndrome Cousin        paternal    Social History   Socioeconomic History  . Marital status: Married    Spouse name: Not on file  . Number of children: 1  . Years of education: Not on file  . Highest education level: Not on file  Occupational History  . 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 Financial resource strain: Not on file  . Food insecurity    Worry: Not on file    Inability: Not on file  . Transportation needs    Medical: Not on file    Non-medical: Not on file  Tobacco Use  . Smoking status: Former Smoker    Quit date: 07/29/2013    Years since quitting: 5.6  . Smokeless tobacco: Never Used  . Tobacco comment: uses e-cig  Substance and Sexual Activity  . Alcohol use: No    Comment: occasional-quit but  still have a couple  . Drug use: Yes    Types: Marijuana    Comment: Marijuana occasional  . Sexual activity: Yes    Partners: Female  Lifestyle  . Physical activity    Days per week: Not on file    Minutes per session: Not on file  . Stress: Not on file  Relationships  . Social Herbalist on phone: Not on file    Gets together: Not on file    Attends religious service: Not on file    Active member of club or organization: Not on file    Attends meetings of clubs or organizations: Not on file    Relationship status: Not on file  . Intimate partner violence    Fear of current or ex partner: Not on file    Emotionally abused: Not on file    Physically abused: Not on file    Forced sexual activity: Not on file  Other Topics Concern  . Not on file  Social History Narrative   Does not get regular exercise.      Outpatient Medications Prior to Visit  Medication Sig Dispense Refill  . esomeprazole (NEXIUM) 40 MG capsule Take 1 capsule by mouth daily.    Marland Kitchen allopurinol (ZYLOPRIM) 300 MG tablet Take 1 tablet (300 mg total) by mouth daily. 90 tablet 3  . amLODipine (NORVASC) 10 MG tablet Take 1 tablet (10 mg total) by mouth daily. 90 tablet 1  . esomeprazole (NEXIUM) 40 MG packet Take 40 mg by mouth daily before breakfast. 90 each 1  . ibuprofen (ADVIL,MOTRIN) 800 MG tablet Take 1 tablet (800 mg total) by mouth every 8 (eight) hours as needed. 30 tablet 0  . phentermine 37.5 MG capsule Take 1 capsule (37.5 mg total) by mouth every morning. 30 capsule 0   No facility-administered medications prior to visit.     Allergies  Allergen Reactions  . Penicillins Rash    Has patient had a PCN reaction causing immediate rash, facial/tongue/throat swelling, SOB or lightheadedness with hypotension:No  Has patient had a PCN reaction causing severe rash involving mucus membranes or skin necrosis:Yes (head to toe) Has patient had a PCN reaction that required hospitalization:No Has  patient had a PCN reaction occurring within the last 10 years:No If all of the  above answers are "NO", then may proceed with Cephalosporin use.     ROS     Objective:    Physical Exam  Constitutional: He is oriented to person, place, and time. He appears well-developed and well-nourished.  HENT:  Head: Normocephalic and atraumatic.  Cardiovascular: Normal rate, regular rhythm and normal heart sounds.  Pulmonary/Chest: Effort normal and breath sounds normal.  Genitourinary: Prostate is enlarged. Prostate is not tender.  Neurological: He is alert and oriented to person, place, and time.  Skin: Skin is warm and dry.  Psychiatric: He has a normal mood and affect. His behavior is normal.   Prostate is 2+ AND SYMMETRIC.    BP (!) 162/93   Pulse 85   Temp 98.6 F (37 C)   Ht 6\' 1"  (1.854 m)   Wt (!) 306 lb (138.8 kg)   SpO2 99%   BMI 40.37 kg/m  Wt Readings from Last 3 Encounters:  03/06/19 (!) 306 lb (138.8 kg)  09/18/18 284 lb (128.8 kg)  08/18/18 297 lb (134.7 kg)    Health Maintenance Due  Topic Date Due  . HIV Screening  01/22/1986  . INFLUENZA VACCINE  01/27/2019    There are no preventive care reminders to display for this patient.   Lab Results  Component Value Date   TSH 1.019 01/21/2014   Lab Results  Component Value Date   WBC 11.2 (H) 08/11/2016   HGB 13.0 08/11/2016   HCT 39.7 08/11/2016   MCV 87.6 08/11/2016   PLT 220 08/11/2016   Lab Results  Component Value Date   NA 138 08/10/2016   K 4.4 08/10/2016   CO2 24 08/10/2016   GLUCOSE 129 (H) 08/10/2016   BUN 11 08/10/2016   CREATININE 0.84 08/10/2016   BILITOT 0.8 08/05/2016   ALKPHOS 64 08/05/2016   AST 23 08/05/2016   ALT 34 08/05/2016   PROT 7.2 08/05/2016   ALBUMIN 4.2 08/05/2016   CALCIUM 8.5 (L) 08/10/2016   ANIONGAP 7 08/10/2016   Lab Results  Component Value Date   CHOL 236 (H) 03/08/2014   Lab Results  Component Value Date   HDL 54 03/08/2014   Lab Results  Component  Value Date   LDLCALC 113 (H) 03/08/2014   Lab Results  Component Value Date   TRIG 343 (H) 03/08/2014   Lab Results  Component Value Date   CHOLHDL 4.4 03/08/2014   Lab Results  Component Value Date   HGBA1C 5.8 (H) 08/05/2016       Assessment & Plan:   Problem List Items Addressed This Visit      Cardiovascular and Mediastinum   Essential hypertension    BP is high.  Not on his amlodipine. Will refill the medication.  F/U in 4 weeks for BP check.        Relevant Medications   amLODipine (NORVASC) 10 MG tablet   Other Relevant Orders   Uric acid   PSA   COMPLETE METABOLIC PANEL WITH GFR     Other   Gout - Primary    Out of uric acid for a couple of months. Will restart med.  Had problems getting it from pharmacy. Will use [facility]. Plan to recheck Uric acid once restarts medication.  Will refer to ortho      Relevant Orders   Uric acid   PSA   COMPLETE METABOLIC PANEL WITH GFR   Benign prostatic hyperplasia with nocturia    Gust new diagnosis.  Exam confirmed enlarged prostate.  Will check lab PSA today.  And go ahead and start Flomax.  New prescription sent to pharmacy.  Follow-up in 2 months.      Relevant Orders   POC Hemoccult Bld/Stl (1-Cd Office Dx) (Completed)    Other Visit Diagnoses    Arthralgia, unspecified joint       Relevant Orders   Ambulatory referral to Orthopedic Surgery   Need for immunization against influenza       Relevant Orders   Flu Vaccine QUAD 36+ mos IM (Completed)   Left hip pain       Relevant Orders   DG HIP UNILAT WITH PELVIS 2-3 VIEWS LEFT     Left hip pain-I like to get update x-rays-it sounds like he may have some significant arthritis based on previous imaging that he was describing.  It also like to refer him to orthopedics as well.  We will check with his insurance to see if he may need to go to a special imaging facility.  Meds ordered this encounter  Medications  . DISCONTD: allopurinol (ZYLOPRIM) 300 MG tablet     Sig: Take 1 tablet (300 mg total) by mouth daily.    Dispense:  90 tablet    Refill:  3  . DISCONTD: allopurinol (ZYLOPRIM) 300 MG tablet    Sig: Take 1 tablet (300 mg total) by mouth daily.    Dispense:  90 tablet    Refill:  3  . amLODipine (NORVASC) 10 MG tablet    Sig: Take 1 tablet (10 mg total) by mouth daily.    Dispense:  90 tablet    Refill:  1  . tamsulosin (FLOMAX) 0.4 MG CAPS capsule    Sig: Take 1 capsule (0.4 mg total) by mouth daily.    Dispense:  90 capsule    Refill:  0  . allopurinol (ZYLOPRIM) 300 MG tablet    Sig: Take 1 tablet (300 mg total) by mouth daily.    Dispense:  90 tablet    Refill:  3     Beatrice Lecher, MD

## 2019-03-06 NOTE — Assessment & Plan Note (Signed)
BP is high.  Not on his amlodipine. Will refill the medication.  F/U in 4 weeks for BP check.

## 2019-03-06 NOTE — Assessment & Plan Note (Signed)
Gust new diagnosis.  Exam confirmed enlarged prostate.  Will check lab PSA today.  And go ahead and start Flomax.  New prescription sent to pharmacy.  Follow-up in 2 months.

## 2019-03-06 NOTE — Telephone Encounter (Signed)
Patient's wife returned my call.   Patient needs x-ray order faxed to [facility] at: Phone: [phone] Fax: [phone]  Patient needs lab orders faxed to [facility] at: Phone: [phone] Fax: [phone]  Re-ordering labs since they are currently entered for [facility].  Labs and imaging order faxed

## 2019-03-06 NOTE — Assessment & Plan Note (Signed)
Out of uric acid for a couple of months. Will restart med.  Had problems getting it from pharmacy. Will use [facility]. Plan to recheck Uric acid once restarts medication.  Will refer to ortho

## 2019-03-14 ENCOUNTER — Telehealth: Payer: Self-pay | Admitting: Family Medicine

## 2019-03-14 NOTE — Telephone Encounter (Signed)
Call pt: xray of his shows moderate to severe arthritis.  I would like to get him with ortho.  Would he like to go to [place]?

## 2019-03-15 NOTE — Telephone Encounter (Signed)
Patient advised. He is already scheduled with [place] on Monday at 2:30.

## 2019-03-29 ENCOUNTER — Encounter: Payer: Self-pay | Admitting: Family Medicine

## 2019-03-29 LAB — URIC ACID: Uric Acid: 8.1

## 2019-08-23 ENCOUNTER — Other Ambulatory Visit: Payer: Self-pay | Admitting: Family Medicine

## 2019-12-24 ENCOUNTER — Other Ambulatory Visit: Payer: Self-pay | Admitting: Family Medicine

## 2021-09-03 ENCOUNTER — Encounter: Payer: Self-pay | Admitting: Gastroenterology
